# Patient Record
Sex: Female | Born: 1995 | Race: White | Hispanic: No | Marital: Single | Smoking: Never smoker
Health system: Southern US, Community
[De-identification: ages and names within clinical notes are randomized; demographics above are authoritative.]

## PROBLEM LIST (undated history)

## (undated) DIAGNOSIS — N83299 Other ovarian cyst, unspecified side: Secondary | ICD-10-CM

## (undated) DIAGNOSIS — N920 Excessive and frequent menstruation with regular cycle: Principal | ICD-10-CM

## (undated) DIAGNOSIS — Z789 Other specified health status: Secondary | ICD-10-CM

## (undated) DIAGNOSIS — R635 Abnormal weight gain: Secondary | ICD-10-CM

## (undated) DIAGNOSIS — R1031 Right lower quadrant pain: Secondary | ICD-10-CM

## (undated) DIAGNOSIS — N83209 Unspecified ovarian cyst, unspecified side: Secondary | ICD-10-CM

## (undated) DIAGNOSIS — R102 Pelvic and perineal pain: Secondary | ICD-10-CM

## (undated) DIAGNOSIS — U071 COVID-19: Secondary | ICD-10-CM

## (undated) DIAGNOSIS — N949 Unspecified condition associated with female genital organs and menstrual cycle: Secondary | ICD-10-CM

## (undated) DIAGNOSIS — G43909 Migraine, unspecified, not intractable, without status migrainosus: Secondary | ICD-10-CM

## (undated) HISTORY — DX: Unspecified condition associated with female genital organs and menstrual cycle: N94.9

## (undated) HISTORY — DX: Migraine, unspecified, not intractable, without status migrainosus: G43.909

## (undated) HISTORY — DX: Right lower quadrant pain: R10.31

## (undated) HISTORY — DX: Abnormal weight gain: R63.5

## (undated) HISTORY — DX: Pelvic and perineal pain: R10.2

## (undated) HISTORY — PX: NO PAST SURGERIES: SHX2092

## (undated) HISTORY — DX: Excessive and frequent menstruation with regular cycle: N92.0

## (undated) HISTORY — DX: Unspecified ovarian cyst, unspecified side: N83.209

---
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
## 2014-02-24 ENCOUNTER — Ambulatory Visit (INDEPENDENT_AMBULATORY_CARE_PROVIDER_SITE_OTHER): Payer: BC Managed Care – PPO | Admitting: Family Medicine

## 2014-02-24 ENCOUNTER — Ambulatory Visit: Payer: BC Managed Care – PPO

## 2014-02-24 VITALS — BP 102/64 | HR 79 | Temp 97.8°F | Resp 16 | Ht 66.0 in | Wt 153.4 lb

## 2014-02-24 DIAGNOSIS — R040 Epistaxis: Secondary | ICD-10-CM

## 2014-02-24 DIAGNOSIS — R1031 Right lower quadrant pain: Secondary | ICD-10-CM

## 2014-02-24 DIAGNOSIS — N946 Dysmenorrhea, unspecified: Secondary | ICD-10-CM

## 2014-02-24 DIAGNOSIS — R109 Unspecified abdominal pain: Secondary | ICD-10-CM

## 2014-02-24 DIAGNOSIS — R10A1 Flank pain, right side: Secondary | ICD-10-CM

## 2014-02-24 DIAGNOSIS — R042 Hemoptysis: Secondary | ICD-10-CM

## 2014-02-24 LAB — POCT URINALYSIS DIPSTICK
BILIRUBIN UA: NEGATIVE
Glucose, UA: NEGATIVE
KETONES UA: NEGATIVE
LEUKOCYTES UA: NEGATIVE
Nitrite, UA: NEGATIVE
SPEC GRAV UA: 1.02
Urobilinogen, UA: 0.2
pH, UA: 6

## 2014-02-24 LAB — POCT CBC
GRANULOCYTE PERCENT: 66.2 % (ref 37–80)
HCT, POC: 41 % (ref 37.7–47.9)
Hemoglobin: 13.1 g/dL (ref 12.2–16.2)
Lymph, poc: 2 (ref 0.6–3.4)
MCH, POC: 28.9 pg (ref 27–31.2)
MCHC: 32 g/dL (ref 31.8–35.4)
MCV: 90.4 fL (ref 80–97)
MID (CBC): 0.5 (ref 0–0.9)
MPV: 12.4 fL (ref 0–99.8)
PLATELET COUNT, POC: 254 10*3/uL (ref 142–424)
POC GRANULOCYTE: 4.8 (ref 2–6.9)
POC LYMPH %: 27.4 % (ref 10–50)
POC MID %: 6.4 % (ref 0–12)
RBC: 4.53 M/uL (ref 4.04–5.48)
RDW, POC: 14.5 %
WBC: 7.2 10*3/uL (ref 4.6–10.2)

## 2014-02-24 LAB — POCT UA - MICROSCOPIC ONLY
Casts, Ur, LPF, POC: NEGATIVE
Crystals, Ur, HPF, POC: NEGATIVE
MUCUS UA: POSITIVE
WBC, UR, HPF, POC: NEGATIVE
YEAST UA: NEGATIVE

## 2014-02-24 NOTE — Progress Notes (Signed)
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 She had not been sick with a cough for for infection. She is on her menstrual cycle for 2 days. She awakened at 4 AM with a severe right-sided pain. It was intense, no nausea or vomiting, no appetite. She said the pain as above where she would expect her ovarian pains to come from. She did feel short of breath. She is generally quite healthy.  Objective: Pleasant alert young lady. Throat clear. Nose unremarkable. Neck supple without nodes. Chest is clear to auscultation. Heart regular without murmurs gallops or arrhythmias abdomen has bowel sounds, soft without masses. It is tender in the far lateral right mid and lower abdomen. It seems to be more lateral to McBurney's point, but is tender over that way also. She does hurt in the right CVA area some, but the primary pain seems come lateral to that. The worst pain is somewhere above the lateral iliac crest.  Assessment: Right lower quadrant abdominal and flank pain Epistaxis Hemoptysis, probably secondary to the epistaxis Menstrual cycle  Plan: Chest x-ray, abdominal series, CBC, urinalysis.  This is an atypical pain. She is not on any oral contraception and not sexually involved, and the pain seems to be too low for a pulmonary embolus although she did have hemoptysis. Hemoptysis with preceding this and seems to be more associated with having had epistaxis. The pain does not seem to be consistent with dysmenorrhea. It also does not have enough symptoms to me totally consistent with an appendicitis. Kidney stone would be a possibility, though does not seem likely. Father has had kidney stones.  Results for orders placed in visit on 02/24/14  POCT URINALYSIS DIPSTICK      Result Value Ref Range   Color, UA dark yellow     Clarity, UA slightly cloudy     Glucose, UA neg     Bilirubin, UA  neg     Ketones, UA neg     Spec Grav, UA 1.020     Blood, UA large     pH, UA 6.0     Protein, UA trace     Urobilinogen, UA 0.2     Nitrite, UA neg     Leukocytes, UA Negative    POCT UA - MICROSCOPIC ONLY      Result Value Ref Range   WBC, Ur, HPF, POC neg     RBC, urine, microscopic 15-20     Bacteria, U Microscopic trace     Mucus, UA positive     Epithelial cells, urine per micros 0-1     Crystals, Ur, HPF, POC neg     Casts, Ur, LPF, POC neg     Yeast, UA neg    POCT CBC      Result Value Ref Range   WBC 7.2  4.6 - 10.2 K/uL   Lymph, poc 2.0  0.6 - 3.4   POC LYMPH PERCENT 27.4  10 - 50 %L   MID (cbc) 0.5  0 - 0.9   POC MID % 6.4  0 - 12 %M   POC Granulocyte 4.8  2 - 6.9   Granulocyte percent 66.2  37 - 80 %G   RBC 4.53  4.04 - 5.48 M/uL   Hemoglobin 13.1  12.2 - 16.2 g/dL   HCT, POC 16.1  09.6 - 47.9 %   MCV 90.4  80 - 97 fL   MCH, POC 28.9  27 - 31.2 pg   MCHC 32.0  31.8 - 35.4 g/dL   RDW, POC 45.414.5     Platelet Count, POC 254  142 - 424 K/uL   MPV 12.4  0 - 99.8 fL   [facility] reading (PRIMARY) by  Dr. Alwyn RenHopper Normal chest and abdomen  Abdominal pain, etiology still unclear. The blood in the urine is probably from her menses. The pain may be menstrual related. Will try to get a stat ultrasound of the abdomen and pelvis for right-sided abdominal  Reexamine. Pain seems a little bit more to the pelvic area. Will order ultrasound and see what the result shows. Patient left for the ultrasound at 11:15

## 2014-02-27 ENCOUNTER — Encounter: Payer: Self-pay | Admitting: Adult Health

## 2014-02-27 ENCOUNTER — Ambulatory Visit (INDEPENDENT_AMBULATORY_CARE_PROVIDER_SITE_OTHER): Payer: BC Managed Care – PPO | Admitting: Adult Health

## 2014-02-27 VITALS — BP 120/80 | Ht 68.0 in | Wt 158.0 lb

## 2014-02-27 DIAGNOSIS — R1031 Right lower quadrant pain: Secondary | ICD-10-CM

## 2014-02-27 DIAGNOSIS — Z7689 Persons encountering health services in other specified circumstances: Secondary | ICD-10-CM

## 2014-02-27 DIAGNOSIS — N920 Excessive and frequent menstruation with regular cycle: Secondary | ICD-10-CM

## 2014-02-27 HISTORY — DX: Excessive and frequent menstruation with regular cycle: N92.0

## 2014-02-27 HISTORY — DX: Right lower quadrant pain: R10.31

## 2014-02-27 MED ORDER — NORETHINDRONE 0.35 MG PO TABS: 1.0000 | ORAL_TABLET | Freq: Every day | ORAL | Status: DC

## 2014-02-27 NOTE — Progress Notes (Signed)
Subjective:     Patient ID: Diane Shelton, female   DOB: [birth year], 18 y.o.   MRN: [id number]  HPI Diane Shelton is a 18 year old white female, in complaining of having pain in right side, Sunday, was seen at Urgent Care, had negative labs,xray and urine.Period started too.Has heavy periods.She started at age 18 and they are regular.Has migraines with aura.Got all 3 gardasil shots, has never had sex.She is a IT sales professionalfirefighter.  Review of Systems See HPI Reviewed past medical,surgical, social and family history. Reviewed medications and allergies.     Objective:   Physical Exam BP 120/80  Ht 5\' 8"  (1.727 m)  Wt 158 lb (71.668 kg)  BMI 24.03 kg/m2  LMP 02/23/2014    Skin warm and dry.Pelvic: external genitalia is normal in appearance, vagina: period like blood, cervix:smooth,negative CMT, uterus: normal size, shape and contour, non tender, no masses felt, adnexa: no masses, has some tenderness noted in RLQ over ovary. Discussed with pt and Mom could be ovarian cyst, will get US and will start OCs, and they agree.  Assessment:     Menorrhagia RLQ pain Period management    Plan:     Rx micronor disp 1 pack take 1 daily with 11 refills Return in 1 week for US Review handout on menorrhagia,pelvic pain and ovarian cyst   Call prn

## 2014-02-27 NOTE — Patient Instructions (Signed)
Ovarian Cyst An ovarian cyst is a fluid-filled sac that forms on an ovary. The ovaries are small organs that produce eggs in women. Various types of cysts can form on the ovaries. Most are not cancerous. Many do not cause problems, and they often go away on their own. Some may cause symptoms and require treatment. Common types of ovarian cysts include:  Functional cysts These cysts may occur every month during the menstrual cycle. This is normal. The cysts usually go away with the next menstrual cycle if the woman does not get pregnant. Usually, there are no symptoms with a functional cyst.  Endometrioma cysts These cysts form from the tissue that lines the uterus. They are also called "chocolate cysts" because they become filled with blood that turns brown. This type of cyst can cause pain in the lower abdomen during intercourse and with your menstrual period.  Cystadenoma cysts This type develops from the cells on the outside of the ovary. These cysts can get very big and cause lower abdomen pain and pain with intercourse. This type of cyst can twist on itself, cut off its blood supply, and cause severe pain. It can also easily rupture and cause a lot of pain.  Dermoid cysts This type of cyst is sometimes found in both ovaries. These cysts may contain different kinds of body tissue, such as skin, teeth, hair, or cartilage. They usually do not cause symptoms unless they get very big.  Theca lutein cysts These cysts occur when too much of a certain hormone (human chorionic gonadotropin) is produced and overstimulates the ovaries to produce an egg. This is most common after procedures used to assist with the conception of a baby (in vitro fertilization). CAUSES   Fertility drugs can cause a condition in which multiple large cysts are formed on the ovaries. This is called ovarian hyperstimulation syndrome.  A condition called polycystic ovary syndrome can cause hormonal imbalances that can lead to  nonfunctional ovarian cysts. SIGNS AND SYMPTOMS  Many ovarian cysts do not cause symptoms. If symptoms are present, they may include:  Pelvic pain or pressure.  Pain in the lower abdomen.  Pain during sexual intercourse.  Increasing girth (swelling) of the abdomen.  Abnormal menstrual periods.  Increasing pain with menstrual periods.  Stopping having menstrual periods without being pregnant. DIAGNOSIS  These cysts are commonly found during a routine or annual pelvic exam. Tests may be ordered to find out more about the cyst. These tests may include:  Ultrasound.  X-ray of the pelvis.  CT scan.  MRI.  Blood tests. TREATMENT  Many ovarian cysts go away on their own without treatment. Your health care provider may want to check your cyst regularly for 2 3 months to see if it changes. For women in menopause, it is particularly important to monitor a cyst closely because of the higher rate of ovarian cancer in menopausal women. When treatment is needed, it may include any of the following:  A procedure to drain the cyst (aspiration). This may be done using a long needle and ultrasound. It can also be done through a laparoscopic procedure. This involves using a thin, lighted tube with a tiny camera on the end (laparoscope) inserted through a small incision.  Surgery to remove the whole cyst. This may be done using laparoscopic surgery or an open surgery involving a larger incision in the lower abdomen.  Hormone treatment or birth control pills. These methods are sometimes used to help dissolve a cyst. HOME CARE   INSTRUCTIONS   Only take over-the-counter or prescription medicines as directed by your health care provider.  Follow up with your health care provider as directed.  Get regular pelvic exams and Pap tests. SEEK MEDICAL CARE IF:   Your periods are late, irregular, or painful, or they stop.  Your pelvic pain or abdominal pain does not go away.  Your abdomen becomes  larger or swollen.  You have pressure on your bladder or trouble emptying your bladder completely.  You have pain during sexual intercourse.  You have feelings of fullness, pressure, or discomfort in your stomach.  You lose weight for no apparent reason.  You feel generally ill.  You become constipated.  You lose your appetite.  You develop acne.  You have an increase in body and facial hair.  You are gaining weight, without changing your exercise and eating habits.  You think you are pregnant. SEEK IMMEDIATE MEDICAL CARE IF:   You have increasing abdominal pain.  You feel sick to your stomach (nauseous), and you throw up (vomit).  You develop a fever that comes on suddenly.  You have abdominal pain during a bowel movement.  Your menstrual periods become heavier than usual. Document Released: 12/05/2005 Document Revised: 09/25/2013 Document Reviewed: 08/12/2013 [facility] Patient Information 2014 [place], [place]. Pelvic Pain, Female Female pelvic pain can be caused by many different things and start from a variety of places. Pelvic pain refers to pain that is located in the lower half of the abdomen and between your hips. The pain may occur over a short period of time (acute) or may be reoccurring (chronic). The cause of pelvic pain may be related to disorders affecting the female reproductive organs (gynecologic), but it may also be related to the bladder, kidney stones, an intestinal complication, or muscle or skeletal problems. Getting help right away for pelvic pain is important, especially if there has been severe, sharp, or a sudden onset of unusual pain. It is also important to get help right away because some types of pelvic pain can be life threatening.  CAUSES  Below are only some of the causes of pelvic pain. The causes of pelvic pain can be in one of several categories.   Gynecologic.  Pelvic inflammatory disease.  Sexually transmitted infection.  Ovarian cyst  or a twisted ovarian ligament (ovarian torsion).  Uterine lining that grows outside the uterus (endometriosis).  Fibroids, cysts, or tumors.  Ovulation.  Pregnancy.  Pregnancy that occurs outside the uterus (ectopic pregnancy).  Miscarriage.  Labor.  Abruption of the placenta or ruptured uterus.  Infection.  Uterine infection (endometritis).  Bladder infection.  Diverticulitis.  Miscarriage related to a uterine infection (septic abortion).  Bladder.  Inflammation of the bladder (cystitis).  Kidney stone(s).  Gastrointenstinal.  Constipation.  Diverticulitis.  Neurologic.  Trauma.  Feeling pelvic pain because of mental or emotional causes (psychosomatic).  Cancers of the bowel or pelvis. EVALUATION  Your caregiver will want to take a careful history of your concerns. This includes recent changes in your health, a careful gynecologic history of your periods (menses), and a sexual history. Obtaining your family history and medical history is also important. Your caregiver may suggest a pelvic exam. A pelvic exam will help identify the location and severity of the pain. It also helps in the evaluation of which organ system may be involved. In order to identify the cause of the pelvic pain and be properly treated, your caregiver may order tests. These tests may include:   A  pregnancy test.  Pelvic ultrasonography.  An X-ray exam of the abdomen.  A urinalysis or evaluation of vaginal discharge.  Blood tests. HOME CARE INSTRUCTIONS   Only take over-the-counter or prescription medicines for pain, discomfort, or fever as directed by your caregiver.   Rest as directed by your caregiver.   Eat a balanced diet.   Drink enough fluids to make your urine clear or pale yellow, or as directed.   Avoid sexual intercourse if it causes pain.   Apply warm or cold compresses to the lower abdomen depending on which one helps the pain.   Avoid stressful  situations.   Keep a journal of your pelvic pain. Write down when it started, where the pain is located, and if there are things that seem to be associated with the pain, such as food or your menstrual cycle.  Follow up with your caregiver as directed.  SEEK MEDICAL CARE IF:  Your medicine does not help your pain.  You have abnormal vaginal discharge. SEEK IMMEDIATE MEDICAL CARE IF:   You have heavy bleeding from the vagina.   Your pelvic pain increases.   You feel lightheaded or faint.   You have chills.   You have pain with urination or blood in your urine.   You have uncontrolled diarrhea or vomiting.   You have a fever or persistent symptoms for more than 3 days.  You have a fever and your symptoms suddenly get worse.   You are being physically or sexually abused.  MAKE SURE YOU:  Understand these instructions.  Will watch your condition.  Will get help if you are not doing well or get worse. Document Released: 11/01/2004 Document Revised: 06/05/2012 Document Reviewed: 03/26/2012 [facility] Patient Information 2014 [place], [place]. Menorrhagia Menorrhagia is the medical term for when your menstrual periods are heavy or last longer than usual. With menorrhagia, every period you have may cause enough blood loss and cramping that you are unable to maintain your usual activities. CAUSES  In some cases, the cause of heavy periods is unknown, but a number of conditions may cause menorrhagia. Common causes include:  A problem with the hormone-producing thyroid gland (hypothyroid).  Noncancerous growths in the uterus (polyps or fibroids).  An imbalance of the estrogen and progesterone hormones.  One of your ovaries not releasing an egg during one or more months.  Side effects of having an intrauterine device (IUD).  Side effects of some medicines, such as anti-inflammatory medicines or blood thinners.  A bleeding disorder that stops your blood from clotting  normally. SIGNS AND SYMPTOMS  During a normal period, bleeding lasts between 4 and 8 days. Signs that your periods are too heavy include:  You routinely have to change your pad or tampon every 1 or 2 hours because it is completely soaked.  You pass blood clots larger than 1 inch (2.5 cm) in size.  You have bleeding for more than 7 days.  You need to use pads and tampons at the same time because of heavy bleeding.  You need to wake up to change your pads or tampons during the night.  You have symptoms of anemia, such as tiredness, fatigue, or shortness of breath. DIAGNOSIS  Your health care provider will perform a physical exam and ask you questions about your symptoms and menstrual history. Other tests may be ordered based on what the health care provider finds during the exam. These tests can include:  Blood tests To check if you are pregnant or have  hormonal changes, a bleeding or thyroid disorder, low iron levels (anemia), or other problems.  Endometrial biopsy Your health care provider takes a sample of tissue from the inside of your uterus to be examined under a microscope.  Pelvic ultrasound This test uses sound waves to make a picture of your uterus, ovaries, and vagina. The pictures can show if you have fibroids or other growths.  Hysteroscopy For this test, your health care provider will use a small telescope to look inside your uterus. Based on the results of your initial tests, your health care provider may recommend further testing. TREATMENT  Treatment may not be needed. If it is needed, your health care provider may recommend treatment with one or more medicines first. If these do not reduce bleeding enough, a surgical treatment might be an option. The best treatment for you will depend on:   Whether you need to prevent pregnancy.  Your desire to have children in the future.  The cause and severity of your bleeding.  Your opinion and personal preference.   Medicines for menorrhagia may include:  Birth control methods that use hormones These include the pill, skin patch, vaginal ring, shots that you get every 3 months, hormonal IUD, and implant. These treatments reduce bleeding during your menstrual period.  Medicines that thicken blood and slow bleeding.  Medicines that reduce swelling, such as ibuprofen.  Medicines that contain a synthetic hormone called progestin.   Medicines that make the ovaries stop working for a short time.  You may need surgical treatment for menorrhagia if the medicines are unsuccessful. Treatment options include:  Dilation and curettage (D&C) In this procedure, your health care provider opens (dilates) your cervix and then scrapes or suctions tissue from the lining of your uterus to reduce menstrual bleeding.  Operative hysteroscopy This procedure uses a tiny tube with a light (hysteroscope) to view your uterine cavity and can help in the surgical removal of a polyp that may be causing heavy periods.  Endometrial ablation Through various techniques, your health care provider permanently destroys the entire lining of your uterus (endometrium). After endometrial ablation, most women have little or no menstrual flow. Endometrial ablation reduces your ability to become pregnant.  Endometrial resection This surgical procedure uses an electrosurgical wire loop to remove the lining of the uterus. This procedure also reduces your ability to become pregnant.  Hysterectomy Surgical removal of the uterus and cervix is a permanent procedure that stops menstrual periods. Pregnancy is not possible after a hysterectomy. This procedure requires anesthesia and hospitalization. HOME CARE INSTRUCTIONS   Only take over-the-counter or prescription medicines as directed by your health care provider. Take prescribed medicines exactly as directed. Do not change or switch medicines without consulting your health care provider.  Take  any prescribed iron pills exactly as directed by your health care provider. Long-term heavy bleeding may result in low iron levels. Iron pills help replace the iron your body lost from heavy bleeding. Iron may cause constipation. If this becomes a problem, increase the bran, fruits, and roughage in your diet.  Do not take aspirin or medicines that contain aspirin 1 week before or during your menstrual period. Aspirin may make the bleeding worse.  If you need to change your sanitary pad or tampon more than once every 2 hours, stay in bed and rest as much as possible until the bleeding stops.  Eat well-balanced meals. Eat foods high in iron. Examples are leafy green vegetables, meat, liver, eggs, and whole grain breads  and cereals. Do not try to lose weight until the abnormal bleeding has stopped and your blood iron level is back to normal. SEEK MEDICAL CARE IF:   You soak through a pad or tampon every 1 or 2 hours, and this happens every time you have a period.  You need to use pads and tampons at the same time because you are bleeding so much.  You need to change your pad or tampon during the night.  You have a period that lasts for more than 8 days.  You pass clots bigger than 1 inch wide.  You have irregular periods that happen more or less often than once a month.  You feel dizzy or faint.  You feel very weak or tired.  You feel short of breath or feel your heart is beating too fast when you exercise.  You have nausea and vomiting or diarrhea while you are taking your medicine.  You have any problems that may be related to the medicine you are taking. SEEK IMMEDIATE MEDICAL CARE IF:   You soak through 4 or more pads or tampons in 2 hours.  You have any bleeding while you are pregnant. MAKE SURE YOU:   Understand these instructions.  Will watch your condition.  Will get help right away if you are not doing well or get worse. Document Released: 12/05/2005 Document Revised:  09/25/2013 Document Reviewed: 05/26/2013 [facility] Patient Information 2014 [place], [place]. Start the micronor Sunday Return in 1 week for [place] Call prn

## 2014-03-04 ENCOUNTER — Encounter: Payer: BC Managed Care – PPO | Admitting: Adult Health

## 2014-03-07 ENCOUNTER — Ambulatory Visit (INDEPENDENT_AMBULATORY_CARE_PROVIDER_SITE_OTHER): Payer: BC Managed Care – PPO

## 2014-03-07 ENCOUNTER — Encounter: Payer: Self-pay | Admitting: Adult Health

## 2014-03-07 ENCOUNTER — Other Ambulatory Visit: Payer: Self-pay | Admitting: Adult Health

## 2014-03-07 ENCOUNTER — Ambulatory Visit (INDEPENDENT_AMBULATORY_CARE_PROVIDER_SITE_OTHER): Payer: BC Managed Care – PPO | Admitting: Adult Health

## 2014-03-07 VITALS — BP 90/52 | Ht 67.0 in | Wt 152.0 lb

## 2014-03-07 DIAGNOSIS — N920 Excessive and frequent menstruation with regular cycle: Secondary | ICD-10-CM

## 2014-03-07 DIAGNOSIS — R1031 Right lower quadrant pain: Secondary | ICD-10-CM

## 2014-03-07 DIAGNOSIS — N83209 Unspecified ovarian cyst, unspecified side: Secondary | ICD-10-CM

## 2014-03-07 HISTORY — DX: Unspecified ovarian cyst, unspecified side: N83.209

## 2014-03-07 NOTE — Patient Instructions (Signed)
Ovarian Cyst An ovarian cyst is a fluid-filled sac that forms on an ovary. The ovaries are small organs that produce eggs in women. Various types of cysts can form on the ovaries. Most are not cancerous. Many do not cause problems, and they often go away on their own. Some may cause symptoms and require treatment. Common types of ovarian cysts include:  Functional cysts These cysts may occur every month during the menstrual cycle. This is normal. The cysts usually go away with the next menstrual cycle if the woman does not get pregnant. Usually, there are no symptoms with a functional cyst.  Endometrioma cysts These cysts form from the tissue that lines the uterus. They are also called "chocolate cysts" because they become filled with blood that turns brown. This type of cyst can cause pain in the lower abdomen during intercourse and with your menstrual period.  Cystadenoma cysts This type develops from the cells on the outside of the ovary. These cysts can get very big and cause lower abdomen pain and pain with intercourse. This type of cyst can twist on itself, cut off its blood supply, and cause severe pain. It can also easily rupture and cause a lot of pain.  Dermoid cysts This type of cyst is sometimes found in both ovaries. These cysts may contain different kinds of body tissue, such as skin, teeth, hair, or cartilage. They usually do not cause symptoms unless they get very big.  Theca lutein cysts These cysts occur when too much of a certain hormone (human chorionic gonadotropin) is produced and overstimulates the ovaries to produce an egg. This is most common after procedures used to assist with the conception of a baby (in vitro fertilization). CAUSES   Fertility drugs can cause a condition in which multiple large cysts are formed on the ovaries. This is called ovarian hyperstimulation syndrome.  A condition called polycystic ovary syndrome can cause hormonal imbalances that can lead to  nonfunctional ovarian cysts. SIGNS AND SYMPTOMS  Many ovarian cysts do not cause symptoms. If symptoms are present, they may include:  Pelvic pain or pressure.  Pain in the lower abdomen.  Pain during sexual intercourse.  Increasing girth (swelling) of the abdomen.  Abnormal menstrual periods.  Increasing pain with menstrual periods.  Stopping having menstrual periods without being pregnant. DIAGNOSIS  These cysts are commonly found during a routine or annual pelvic exam. Tests may be ordered to find out more about the cyst. These tests may include:  Ultrasound.  X-ray of the pelvis.  CT scan.  MRI.  Blood tests. TREATMENT  Many ovarian cysts go away on their own without treatment. Your health care provider may want to check your cyst regularly for 2 3 months to see if it changes. For women in menopause, it is particularly important to monitor a cyst closely because of the higher rate of ovarian cancer in menopausal women. When treatment is needed, it may include any of the following:  A procedure to drain the cyst (aspiration). This may be done using a long needle and ultrasound. It can also be done through a laparoscopic procedure. This involves using a thin, lighted tube with a tiny camera on the end (laparoscope) inserted through a small incision.  Surgery to remove the whole cyst. This may be done using laparoscopic surgery or an open surgery involving a larger incision in the lower abdomen.  Hormone treatment or birth control pills. These methods are sometimes used to help dissolve a cyst. HOME CARE   INSTRUCTIONS   Only take over-the-counter or prescription medicines as directed by your health care provider.  Follow up with your health care provider as directed.  Get regular pelvic exams and Pap tests. SEEK MEDICAL CARE IF:   Your periods are late, irregular, or painful, or they stop.  Your pelvic pain or abdominal pain does not go away.  Your abdomen becomes  larger or swollen.  You have pressure on your bladder or trouble emptying your bladder completely.  You have pain during sexual intercourse.  You have feelings of fullness, pressure, or discomfort in your stomach.  You lose weight for no apparent reason.  You feel generally ill.  You become constipated.  You lose your appetite.  You develop acne.  You have an increase in body and facial hair.  You are gaining weight, without changing your exercise and eating habits.  You think you are pregnant. SEEK IMMEDIATE MEDICAL CARE IF:   You have increasing abdominal pain.  You feel sick to your stomach (nauseous), and you throw up (vomit).  You develop a fever that comes on suddenly.  You have abdominal pain during a bowel movement.  Your menstrual periods become heavier than usual. Document Released: 12/05/2005 Document Revised: 09/25/2013 Document Reviewed: 08/12/2013 [facility] Patient Information 2014 [place], [place]. Return 3 months

## 2014-03-07 NOTE — Progress Notes (Signed)
Subjective:     Patient ID: Diane Shelton, female   DOB: [birth year], 18 y.o.   MRN: [id number]  HPI Diane Shelton is back for US had RLQ pain.  Review of Systems See HPI Reviewed past medical,surgical, social and family history. Reviewed medications and allergies.     Objective:   Physical Exam BP 90/52  Ht 5\' 7"  (1.702 m)  Wt 152 lb (68.947 kg)  BMI 23.80 kg/m2  LMP 03/08/2015Reviewed US with pt: Uterus 7.2 x 6.1 x 4.8 cm, retroflexed uterus  Endometrium 6.2 mm, symmetrical,  Right ovary 3.6 x 2.9 x 2.8 cm, with 3.1 x 1.9cm simple cyst noted no internal debris or +Doppler flow noted  Left ovary 3.2 x 2.1 x 1.7 cm,  No free fluid noted  Technician Comments:  Retroflexed uterus noted, endometrium-6.292mm, Rt ovary noted with 3.1 x 1.9cm simple cyst, Lt ovary appears WNL, no free fluid noted within pelvis  Difficult to evaluate uterus well due to transabdominal approach only (per pt. Request)     Assessment:     Right ovarian cyst    Plan:     Take micronor Follow up in 3 months Review handout on ovarian cyst

## 2014-06-06 ENCOUNTER — Ambulatory Visit: Payer: BC Managed Care – PPO | Admitting: Adult Health

## 2014-08-26 ENCOUNTER — Ambulatory Visit (INDEPENDENT_AMBULATORY_CARE_PROVIDER_SITE_OTHER): Payer: BC Managed Care – PPO | Admitting: Adult Health

## 2014-08-26 ENCOUNTER — Encounter: Payer: Self-pay | Admitting: Adult Health

## 2014-08-26 VITALS — BP 100/58 | Ht 68.0 in | Wt 159.5 lb

## 2014-08-26 DIAGNOSIS — N949 Unspecified condition associated with female genital organs and menstrual cycle: Secondary | ICD-10-CM

## 2014-08-26 HISTORY — DX: Unspecified condition associated with female genital organs and menstrual cycle: N94.9

## 2014-08-26 MED ORDER — NAPROXEN SODIUM 550 MG PO TABS
550.0000 mg | ORAL_TABLET | Freq: Two times a day (BID) | ORAL | Status: DC
Start: 2014-08-26 — End: 2014-12-29

## 2014-08-26 NOTE — Progress Notes (Signed)
Subjective:     Patient ID: Diane Shelton, female   DOB: May 26, 1996, 18 y.o.   MRN: [id number]  HPI Diane Shelton is a 18 year old white female in complaining of pelvic pain, like when had cyst rupture in past.Is on micronor, but has missed 3 days of this pack.Has not had sex.  Review of Systems See HPI Reviewed past medical,surgical, social and family history. Reviewed medications and allergies.     Objective:   Physical Exam BP 100/58  Ht  (1.727 m)  Wt 159 lb 8 oz (72.349 kg)  BMI 24.26 kg/m2  LMP 08/24/2014   Skin warm and dry.Pelvic: external genitalia is normal in appearance, vagina: period like blood, cervix:smooth, uterus: normal size, shape and contour, non tender, no masses felt, adnexa: no masses, has some  tenderness noted across pelvis more to left now.Pain 5-6, was 8 last night.  Assessment:     Pelvic pain    Plan:    Take micronor Note given for work Rx anaprox ds #30 1 Bid with 2 refills Return in 2-3 days for [place] and see me Review handout on pelvic pain

## 2014-08-26 NOTE — Patient Instructions (Signed)
Pelvic Pain Female pelvic pain can be caused by many different things and start from a variety of places. Pelvic pain refers to pain that is located in the lower half of the abdomen and between your hips. The pain may occur over a short period of time (acute) or may be reoccurring (chronic). The cause of pelvic pain may be related to disorders affecting the female reproductive organs (gynecologic), but it may also be related to the bladder, kidney stones, an intestinal complication, or muscle or skeletal problems. Getting help right away for pelvic pain is important, especially if there has been severe, sharp, or a sudden onset of unusual pain. It is also important to get help right away because some types of pelvic pain can be life threatening.  CAUSES  Below are only some of the causes of pelvic pain. The causes of pelvic pain can be in one of several categories.   Gynecologic.  Pelvic inflammatory disease.  Sexually transmitted infection.  Ovarian cyst or a twisted ovarian ligament (ovarian torsion).  Uterine lining that grows outside the uterus (endometriosis).  Fibroids, cysts, or tumors.  Ovulation.  Pregnancy.  Pregnancy that occurs outside the uterus (ectopic pregnancy).  Miscarriage.  Labor.  Abruption of the placenta or ruptured uterus.  Infection.  Uterine infection (endometritis).  Bladder infection.  Diverticulitis.  Miscarriage related to a uterine infection (septic abortion).  Bladder.  Inflammation of the bladder (cystitis).  Kidney stone(s).  Gastrointestinal.  Constipation.  Diverticulitis.  Neurologic.  Trauma.  Feeling pelvic pain because of mental or emotional causes (psychosomatic).  Cancers of the bowel or pelvis. EVALUATION  Your caregiver will want to take a careful history of your concerns. This includes recent changes in your health, a careful gynecologic history of your periods (menses), and a sexual history. Obtaining your family  history and medical history is also important. Your caregiver may suggest a pelvic exam. A pelvic exam will help identify the location and severity of the pain. It also helps in the evaluation of which organ system may be involved. In order to identify the cause of the pelvic pain and be properly treated, your caregiver may order tests. These tests may include:   A pregnancy test.  Pelvic ultrasonography.  An X-ray exam of the abdomen.  A urinalysis or evaluation of vaginal discharge.  Blood tests. HOME CARE INSTRUCTIONS   Only take over-the-counter or prescription medicines for pain, discomfort, or fever as directed by your caregiver.   Rest as directed by your caregiver.   Eat a balanced diet.   Drink enough fluids to make your urine clear or pale yellow, or as directed.   Avoid sexual intercourse if it causes pain.   Apply warm or cold compresses to the lower abdomen depending on which one helps the pain.   Avoid stressful situations.   Keep a journal of your pelvic pain. Write down when it started, where the pain is located, and if there are things that seem to be associated with the pain, such as food or your menstrual cycle.  Follow up with your caregiver as directed.  SEEK MEDICAL CARE IF:  Your medicine does not help your pain.  You have abnormal vaginal discharge. SEEK IMMEDIATE MEDICAL CARE IF:   You have heavy bleeding from the vagina.   Your pelvic pain increases.   You feel light-headed or faint.   You have chills.   You have pain with urination or blood in your urine.   You have uncontrolled diarrhea   or vomiting.   You have a fever or persistent symptoms for more than 3 days.  You have a fever and your symptoms suddenly get worse.   You are being physically or sexually abused.  MAKE SURE YOU:  Understand these instructions.  Will watch your condition.  Will get help if you are not doing well or get worse. Document Released:  11/01/2004 Document Revised: 04/21/2014 Document Reviewed: 03/26/2012 [facility] Patient Information 2015 [place], [place]. This information is not intended to replace advice given to you by your health care provider. Make sure you discuss any questions you have with your health care provider. Take micronor Return in 3 days for [place]

## 2014-08-28 ENCOUNTER — Ambulatory Visit: Payer: BC Managed Care – PPO | Admitting: Adult Health

## 2014-08-28 ENCOUNTER — Other Ambulatory Visit: Payer: BC Managed Care – PPO

## 2014-09-15 ENCOUNTER — Ambulatory Visit: Payer: BC Managed Care – PPO | Admitting: Adult Health

## 2014-09-15 ENCOUNTER — Other Ambulatory Visit: Payer: BC Managed Care – PPO

## 2014-09-17 ENCOUNTER — Ambulatory Visit
Admission: RE | Admit: 2014-09-17 | Discharge: 2014-09-17 | Disposition: A | Discharge status: Home / Self Care | Payer: Worker's Compensation | Source: Ambulatory Visit | Attending: Family Medicine | Admitting: Family Medicine

## 2014-09-17 ENCOUNTER — Other Ambulatory Visit: Payer: Self-pay | Admitting: Family Medicine

## 2014-09-17 DIAGNOSIS — T1490XA Injury, unspecified, initial encounter: Secondary | ICD-10-CM

## 2014-12-29 ENCOUNTER — Ambulatory Visit (INDEPENDENT_AMBULATORY_CARE_PROVIDER_SITE_OTHER): Payer: BC Managed Care – PPO | Admitting: Physician Assistant

## 2014-12-29 VITALS — BP 110/68 | HR 76 | Temp 98.3°F | Resp 18 | Ht 67.5 in | Wt 184.0 lb

## 2014-12-29 DIAGNOSIS — J029 Acute pharyngitis, unspecified: Secondary | ICD-10-CM

## 2014-12-29 DIAGNOSIS — R5383 Other fatigue: Secondary | ICD-10-CM

## 2014-12-29 LAB — POCT CBC
Granulocyte percent: 50.6 %G (ref 37–80)
HEMATOCRIT: 41.1 % (ref 37.7–47.9)
Hemoglobin: 13.2 g/dL (ref 12.2–16.2)
Lymph, poc: 2.9 (ref 0.6–3.4)
MCH: 27.7 pg (ref 27–31.2)
MCHC: 32.1 g/dL (ref 31.8–35.4)
MCV: 86.2 fL (ref 80–97)
MID (cbc): 0.5 (ref 0–0.9)
MPV: 8.9 fL (ref 0–99.8)
PLATELET COUNT, POC: 249 10*3/uL (ref 142–424)
POC GRANULOCYTE: 3.5 (ref 2–6.9)
POC LYMPH PERCENT: 41.9 %L (ref 10–50)
POC MID %: 7.5 %M (ref 0–12)
RBC: 4.77 M/uL (ref 4.04–5.48)
RDW, POC: 14.8 %
WBC: 7 10*3/uL (ref 4.6–10.2)

## 2014-12-29 LAB — POCT RAPID STREP A (OFFICE): Rapid Strep A Screen: NEGATIVE

## 2014-12-29 NOTE — Patient Instructions (Signed)
Your strep swab was negative today. We sent this for confirmation and will let you know if that comes back different. We drew labs to look to see if you have an EBV infection which could cause mono. I'll let you know the results of those when they get back.  We drew blood today which was normal which is reassuring that you don't have mono.  Please get plenty of rest, drink plenty of fluids, take ibuprofen for pain every 4-6 hours as needed. If you're not feeling better in 3-4 days please return to clinic.  If you have trouble breathing or swallowing please go to the ED asap.

## 2014-12-29 NOTE — Progress Notes (Signed)
Subjective:    Patient ID: Diane Shelton, female    DOB: 1996/10/07, 19 y.o.   MRN: [id number]  PCP: No PCP Per Patient  Chief Complaint  Patient presents with  . Sore Throat    x2 days   . Fatigue   Patient Active Problem List   Diagnosis Date Noted  . Unspecified symptom associated with female genital organs 08/26/2014  . Other and unspecified ovarian cyst 03/07/2014  . Menorrhagia 02/27/2014   Prior to Admission medications   Medication Sig Start Date End Date Taking? Authorizing Provider  norethindrone (MICRONOR,CAMILA,ERRIN) 0.35 MG tablet Take 1 tablet (0.35 mg total) by mouth daily. 02/27/14  Yes Adline Potter, NP   Medications, allergies, past medical history, surgical history, family history, social history and problem list reviewed and updated.  HPI  18 yof with pmh mono 6 months ago presents today with sore throat and fatigue.   She began feeling fatigued approx 3 days ago. Has felt weak. No presyncope or syncope. She then started having a sore throat 2 days ago. No trouble breathing or swallowing. She has had a mild non prod cough past few days. No abd pain, N/V, diarrhea. No rhinorrhea, no otalgia. No SOB, CP, fever, chills. Denies any HA.   She states she had these sx 6 months ago and was eventually diag with mono. She stayed out of work for one week and got plenty of rest, felt better after approx one month.   Review of Systems No dysuria.     Objective:   Physical Exam  Constitutional: She is oriented to person, place, and time. She appears well-developed and well-nourished.  Non-toxic appearance. She does not have a sickly appearance. She does not appear ill. No distress.  BP 110/68 mmHg  Pulse 76  Temp(Src) 98.3 F (36.8 C) (Oral)  Resp 18  Ht 5' 7.5" (1.715 m)  Wt 184 lb (83.462 kg)  BMI 28.38 kg/m2  SpO2 100%  LMP 12/06/2014   HENT:  Right Ear: Tympanic membrane normal.  Left Ear: Tympanic membrane normal.  Nose: Nose normal. No mucosal  edema or rhinorrhea.  Mouth/Throat: Uvula is midline and mucous membranes are normal. No uvula swelling. Oropharyngeal exudate present. No posterior oropharyngeal edema, posterior oropharyngeal erythema or tonsillar abscesses.  Right tonsil slightly enlarged with exudates and mild erythema. No pharyngeal exudates or erythema.   Eyes: Conjunctivae and EOM are normal. Pupils are equal, round, and reactive to light.  Pulmonary/Chest: Effort normal and breath sounds normal. She has no decreased breath sounds. She has no wheezes. She has no rhonchi. She has no rales.  Abdominal: There is no splenomegaly.  Lymphadenopathy:       Head (right side): No submental, no submandibular and no tonsillar adenopathy present.       Head (left side): No submental, no submandibular and no tonsillar adenopathy present.       Right cervical: No superficial cervical, no deep cervical and no posterior cervical adenopathy present.      Left cervical: No superficial cervical, no deep cervical and no posterior cervical adenopathy present.  Neurological: She is alert and oriented to person, place, and time.   Results for orders placed or performed in visit on 12/29/14  POCT rapid strep A  Result Value Ref Range   Rapid Strep A Screen Negative Negative  POCT CBC  Result Value Ref Range   WBC 7.0 4.6 - 10.2 K/uL   Lymph, poc 2.9 0.6 - 3.4   POC LYMPH  PERCENT 41.9 10 - 50 %L   MID (cbc) 0.5 0 - 0.9   POC MID % 7.5 0 - 12 %M   POC Granulocyte 3.5 2 - 6.9   Granulocyte percent 50.6 37 - 80 %G   RBC 4.77 4.04 - 5.48 M/uL   Hemoglobin 13.2 12.2 - 16.2 g/dL   HCT, POC 09.841.1 11.937.7 - 47.9 %   MCV 86.2 80 - 97 fL   MCH, POC 27.7 27 - 31.2 pg   MCHC 32.1 31.8 - 35.4 g/dL   RDW, POC 14.714.8 %   Platelet Count, POC 249 142 - 424 K/uL   MPV 8.9 0 - 99.8 fL      Assessment & Plan:   18 yof with pmh mono 6 months ago presents today with sore throat and fatigue.   Sore throat - Plan: POCT rapid strep A, Culture, Group A  Strep, POCT CBC, Epstein-Barr virus VCA antibody panel Other fatigue - Plan: POCT CBC, Epstein-Barr virus VCA antibody panel --rapid strep neg, cx sent --cbc normal with no lymphocytosis --ebv abi panel drawn, f/u on results though low suspicion for mono with no fever, no headache, no cervical lan, no splenomegaly, and no increased lymphs --rest/fluids/lozenges/ibuprofen --rtc 3-4 days if not improved --er if trouble breathing/swallowing  Donnajean Lopesodd M. Markee Remlinger, PA-C Physician Assistant-Certified [facility]  12/29/2014 2:07 PM

## 2014-12-30 LAB — EPSTEIN-BARR VIRUS VCA ANTIBODY PANEL
EBV EA IGG: 8.7 U/mL (ref <9.0)
EBV NA IgG: 174 U/mL — ABNORMAL HIGH (ref <18.0)
EBV VCA IGM: 20.4 U/mL (ref <36.0)
EBV VCA IgG: 63.2 U/mL — ABNORMAL HIGH (ref <18.0)

## 2014-12-31 LAB — CULTURE, GROUP A STREP

## 2015-01-02 ENCOUNTER — Telehealth: Payer: Self-pay | Admitting: Physician Assistant

## 2015-01-02 NOTE — Telephone Encounter (Signed)
Left message with pt that EBV infx was old, no acute infx. Informed pt that cx came back positive for strep but not group A. Instructed if she's better no further treatment is needed, but if she continues to have sore throat to call clinic, let me know.   Donnajean Lopesodd M. Khary Schaben, PA-C Physician Assistant-Certified [facility]  01/02/2015 1:12 PM

## 2015-01-29 ENCOUNTER — Encounter: Payer: Self-pay | Admitting: Adult Health

## 2015-01-29 ENCOUNTER — Ambulatory Visit (INDEPENDENT_AMBULATORY_CARE_PROVIDER_SITE_OTHER): Payer: BC Managed Care – PPO | Admitting: Adult Health

## 2015-01-29 VITALS — BP 128/60 | Ht 68.0 in | Wt 179.0 lb

## 2015-01-29 DIAGNOSIS — N920 Excessive and frequent menstruation with regular cycle: Secondary | ICD-10-CM

## 2015-01-29 DIAGNOSIS — R635 Abnormal weight gain: Secondary | ICD-10-CM

## 2015-01-29 DIAGNOSIS — R102 Pelvic and perineal pain: Secondary | ICD-10-CM

## 2015-01-29 HISTORY — DX: Abnormal weight gain: R63.5

## 2015-01-29 HISTORY — DX: Pelvic and perineal pain: R10.2

## 2015-01-29 MED ORDER — NAPROXEN SODIUM 550 MG PO TABS: 550.0000 mg | ORAL_TABLET | Freq: Two times a day (BID) | ORAL | Status: DC

## 2015-01-29 MED ORDER — NORETHINDRONE 0.35 MG PO TABS: 1.0000 | ORAL_TABLET | Freq: Every day | ORAL | Status: DC

## 2015-01-29 NOTE — Patient Instructions (Signed)

## 2015-01-29 NOTE — Progress Notes (Signed)
Subjective:     Patient ID: Diane Shelton, female   DOB: Jul 15, 1996, 19 y.o.   MRN: [id number]  HPI Diane Shelton is a 19 year old white female in complaining of pelvic pain x 2 days, that comes and goes, has history of right ovarian cyst.She was on Micronor but stopped 3 months ago, had gained weight and periods still heavy.Period started yesterday with some clots.Has never had sex.Does have some nausea but not bad.Has history of migraines with aura.Has gained 20 lbs since September.  Review of Systems See HPI for positives, all others negative  Reviewed past medical,surgical, social and family history. Reviewed medications and allergies.     Objective:   Physical Exam BP 128/60 mmHg  Ht 5\' 8"  (1.727 m)  Wt 179 lb (81.194 kg)  BMI 27.22 kg/m2  LMP 01/28/2015   Skin warm and dry.Pelvic: external genitalia is normal in appearance no lesions, vagina:period like blood without odor,urethra has no lesions or masses noted, cervix:smooth, negative CMT, uterus: normal size, shape and contour, non tender, no masses felt, adnexa: no masses, some  Tenderness LLQ noted. Bladder is non tender and no masses felt. Discussed that need to close the ovaries down, talked about restarting POP, nexplanon,IUD, and depo as options and she will restart POP. Will schedule follow up US and check TSH. Declines nausea meds for now or narcotic for pain, will try NSAID for pain.  Assessment:     Pelvic pain Menorrhagia Weight gain    Plan:    Rx micronor take 1 daily with 11 refills, start Sunday  Rx anaprox DS # 30 1 bid with 1 refill Check TSH Return in 1 week for gyn US to assess ovaries for cysts, will talk when results back Review handouts on depo, skyla,liletta and nexplanon

## 2015-01-30 LAB — TSH: TSH: 2.13 u[IU]/mL (ref 0.450–4.500)

## 2015-02-03 ENCOUNTER — Telehealth: Payer: Self-pay | Admitting: Adult Health

## 2015-02-03 NOTE — Telephone Encounter (Signed)
Pt aware of TSH and feels better but still with occasional discomfort

## 2015-02-05 ENCOUNTER — Other Ambulatory Visit: Payer: BC Managed Care – PPO

## 2015-02-09 ENCOUNTER — Other Ambulatory Visit: Payer: BC Managed Care – PPO

## 2015-09-30 ENCOUNTER — Encounter (HOSPITAL_BASED_OUTPATIENT_CLINIC_OR_DEPARTMENT_OTHER): Payer: Self-pay

## 2015-09-30 ENCOUNTER — Emergency Department (HOSPITAL_BASED_OUTPATIENT_CLINIC_OR_DEPARTMENT_OTHER)
Admission: EM | Admit: 2015-09-30 | Discharge: 2015-09-30 | Disposition: A | Discharge status: Home / Self Care | Payer: BC Managed Care – PPO | Attending: Emergency Medicine | Admitting: Emergency Medicine

## 2015-09-30 DIAGNOSIS — R110 Nausea: Secondary | ICD-10-CM | POA: Insufficient documentation

## 2015-09-30 DIAGNOSIS — R51 Headache: Secondary | ICD-10-CM | POA: Diagnosis not present

## 2015-09-30 DIAGNOSIS — R55 Syncope and collapse: Secondary | ICD-10-CM | POA: Diagnosis present

## 2015-09-30 DIAGNOSIS — Z8742 Personal history of other diseases of the female genital tract: Secondary | ICD-10-CM | POA: Diagnosis not present

## 2015-09-30 DIAGNOSIS — Z3202 Encounter for pregnancy test, result negative: Secondary | ICD-10-CM | POA: Diagnosis not present

## 2015-09-30 DIAGNOSIS — Z8679 Personal history of other diseases of the circulatory system: Secondary | ICD-10-CM | POA: Diagnosis not present

## 2015-09-30 DIAGNOSIS — R0789 Other chest pain: Secondary | ICD-10-CM | POA: Diagnosis not present

## 2015-09-30 HISTORY — DX: Other ovarian cyst, unspecified side: N83.299

## 2015-09-30 LAB — URINALYSIS, ROUTINE W REFLEX MICROSCOPIC
Bilirubin Urine: NEGATIVE
GLUCOSE, UA: NEGATIVE mg/dL
Hgb urine dipstick: NEGATIVE
KETONES UR: NEGATIVE mg/dL
LEUKOCYTES UA: NEGATIVE
NITRITE: NEGATIVE
PROTEIN: NEGATIVE mg/dL
Specific Gravity, Urine: 1.014 (ref 1.005–1.030)
UROBILINOGEN UA: 0.2 mg/dL (ref 0.0–1.0)
pH: 5.5 (ref 5.0–8.0)

## 2015-09-30 LAB — CBC
HCT: 43.6 % (ref 36.0–46.0)
Hemoglobin: 14.3 g/dL (ref 12.0–15.0)
MCH: 28.4 pg (ref 26.0–34.0)
MCHC: 32.8 g/dL (ref 30.0–36.0)
MCV: 86.7 fL (ref 78.0–100.0)
Platelets: 304 10*3/uL (ref 150–400)
RBC: 5.03 MIL/uL (ref 3.87–5.11)
RDW: 14.1 % (ref 11.5–15.5)
WBC: 9.8 10*3/uL (ref 4.0–10.5)

## 2015-09-30 LAB — BASIC METABOLIC PANEL
Anion gap: 8 (ref 5–15)
BUN: 12 mg/dL (ref 6–20)
CHLORIDE: 105 mmol/L (ref 101–111)
CO2: 26 mmol/L (ref 22–32)
CREATININE: 0.96 mg/dL (ref 0.44–1.00)
Calcium: 9.5 mg/dL (ref 8.9–10.3)
GFR calc Af Amer: >60 mL/min (ref >60)
Glucose, Bld: 103 mg/dL — ABNORMAL HIGH (ref 65–99)
Potassium: 4 mmol/L (ref 3.5–5.1)
SODIUM: 139 mmol/L (ref 135–145)

## 2015-09-30 LAB — PREGNANCY, URINE: PREG TEST UR: NEGATIVE

## 2015-09-30 NOTE — Discharge Instructions (Signed)
Please follow up with a primary care provider from the Resource Guide provided below within the next week. Please return to the Emergency Department if symptoms worsen or new onset of fever, chest pain, difficulty breathing.

## 2015-09-30 NOTE — ED Provider Notes (Signed)
CSN: [id number]     Arrival date & time 09/30/15  1923 History   First MD Initiated Contact with Patient 09/30/15 2100     Chief Complaint  Patient presents with  . Loss of Consciousness     (Consider location/radiation/quality/duration/timing/severity/associated sxs/prior Treatment) HPI Comments: Patient is a 19 year old female who presents to the ED s/p syncopal episode, onset 10am. 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 Patient denies head injury. Endorses nausea earlier this afternoon that has since resolved. Endorses headache and chest tightness. Denies fever, chills, lightheadedness, dizziness, visual changes, difficulty breathing, cough, wheezing, CP, SOB, palpitations, abdominal pain, vomiting, numbness, tingling, weakness. Patient denies any prior histories of syncopal episodes. Denies any new medications.   Patient is a 19 y.o. female presenting with syncope.  Loss of Consciousness Associated symptoms: headaches and nausea     Past Medical History  Diagnosis Date  . Menorrhagia 02/27/2014  . RLQ abdominal pain 02/27/2014  . Migraines     with aura  . Other and unspecified ovarian cyst 03/07/2014  . Unspecified symptom associated with female genital organs 08/26/2014  . Pelvic pain in female 01/29/2015  . Weight gain 01/29/2015  . Physiological ovarian cysts    History reviewed. No pertinent past surgical history. Family History  Problem Relation Age of Onset  . Cancer Father     bladder  . Kidney failure Father   . Cancer Maternal Grandfather     brain tumor  . Alzheimer's disease Paternal Grandfather   . Congestive Heart Failure Maternal Grandmother   . Congestive Heart Failure Paternal Grandmother   . Cancer Maternal Aunt     breast   Social History  Substance Use Topics  . Smoking status: Never Smoker   .  Smokeless tobacco: Never Used  . Alcohol Use: No   OB History    Gravida Para Term Preterm AB TAB SAB Ectopic Multiple Living   0 0 0 0 0 0 0 0 0 0      Review of Systems  Respiratory: Positive for chest tightness.   Cardiovascular: Positive for syncope.  Gastrointestinal: Positive for nausea.  Neurological: Positive for syncope and headaches.  All other systems reviewed and are negative.     Allergies  Review of patient's allergies indicates no known allergies.  Home Medications   Prior to Admission medications   Not on File   BP 118/66 mmHg  Pulse 79  Temp(Src) 98 F (36.7 C) (Oral)  Resp 18  Ht 5\' 8"  (1.727 m)  Wt 184 lb (83.462 kg)  BMI 27.98 kg/m2  SpO2 100%  LMP 09/20/2015 Physical Exam  Constitutional: She is oriented to person, place, and time. She appears well-developed and well-nourished. No distress.  HENT:  Head: Normocephalic and atraumatic. Head is without raccoon's eyes, without Battle's sign, without abrasion, without contusion and without laceration.  Right Ear: Tympanic membrane normal.  Left Ear: Tympanic membrane normal.  Nose: Nose normal. Right sinus exhibits no maxillary sinus tenderness and no frontal sinus tenderness. Left sinus exhibits no maxillary sinus tenderness and no frontal sinus tenderness.  Mouth/Throat: Uvula is midline, oropharynx is clear and moist and mucous membranes are normal. No oropharyngeal exudate.  Eyes: Conjunctivae and EOM are normal. Pupils are equal, round, and reactive to light. Right eye exhibits no discharge. Left eye exhibits no  discharge. No scleral icterus.  Neck: Normal range of motion. Neck supple.  Cardiovascular: Normal rate, regular rhythm, normal heart sounds and intact distal pulses.   Pulmonary/Chest: Effort normal and breath sounds normal. No respiratory distress. She has no wheezes. She has no rales. She exhibits tenderness (Mid sternal chest wall TTP).  Abdominal: Soft. Bowel sounds are normal. She  exhibits no distension and no mass. There is no tenderness. There is no rebound and no guarding.  Musculoskeletal: Normal range of motion. She exhibits no edema or tenderness.  Lymphadenopathy:    She has no cervical adenopathy.  Neurological: She is alert and oriented to person, place, and time. She has normal strength and normal reflexes. No cranial nerve deficit or sensory deficit. Coordination and gait normal.  Skin: Skin is warm and dry.  Nursing note and vitals reviewed.   ED Course  Procedures (including critical care time) Labs Review Labs Reviewed  URINALYSIS, ROUTINE W REFLEX MICROSCOPIC (NOT AT [facility]) - Abnormal; Notable for the following:    APPearance CLOUDY (*)    All other components within normal limits  BASIC METABOLIC PANEL - Abnormal; Notable for the following:    Glucose, Bld 103 (*)    All other components within normal limits  PREGNANCY, URINE  CBC    Imaging Review No results found. I have personally reviewed and evaluated these images and lab results as part of my medical decision-making.   EKG Interpretation   Date/Time:  Wednesday September 30 2015 19:50:16 EDT Ventricular Rate:  90 PR Interval:  140 QRS Duration: 88 QT Interval:  376 QTC Calculation: 459 R Axis:   113 Text Interpretation:  Normal sinus rhythm with sinus arrhythmia Right axis  deviation Abnormal ECG No significant change since last tracing Confirmed  by YAO  MD, DAVID ([id number]) on 09/30/2015 8:27:42 PM     Filed Vitals:   09/30/15 2030  BP: 118/66  Pulse: 79  Temp:   Resp:     MDM   Final diagnoses:  Syncope, unspecified syncope type    Patient presents after a syncopal episode that occurred earlier this morning while 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 Denies head injury. Denies prior history of syncopal events. Endorses headache nausea and chest tightness that of since resolved. Exam unremarkable, cardiac exam benign. No neuro deficits. No signs of  trauma. EKG showed normal sinus rhythm. Labs unremarkable. Urinalysis unremarkable. Pregnancy negative. Negative orthostatics. I suspect syncopal episode likely due to vasovagal episode. I do not feel that any further workup or imaging is warranted at this time. Plan to discharge patient home. Patient given resource guide to follow up with primary care provider this week.   Evaluation does not show pathology requring ongoing emergent intervention or admission. Pt is hemodynamically stable and mentating appropriately. Discussed findings/results and plan with patient/guardian, who agrees with plan. All questions answered. Return precautions discussed and outpatient follow up given.      Satira Sark [place], [place] 09/30/15 2220  Richardean Canal, MD 10/01/15 [phone]

## 2015-09-30 NOTE — ED Notes (Signed)
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

## 2016-11-07 ENCOUNTER — Encounter (HOSPITAL_COMMUNITY): Payer: Self-pay | Admitting: Emergency Medicine

## 2016-11-07 ENCOUNTER — Emergency Department (HOSPITAL_COMMUNITY): Payer: BC Managed Care – PPO

## 2016-11-07 ENCOUNTER — Emergency Department (HOSPITAL_COMMUNITY)
Admission: EM | Admit: 2016-11-07 | Discharge: 2016-11-08 | Disposition: A | Discharge status: Home / Self Care | Payer: BC Managed Care – PPO | Attending: Emergency Medicine | Admitting: Emergency Medicine

## 2016-11-07 DIAGNOSIS — Y929 Unspecified place or not applicable: Secondary | ICD-10-CM | POA: Diagnosis not present

## 2016-11-07 DIAGNOSIS — S5002XA Contusion of left elbow, initial encounter: Secondary | ICD-10-CM | POA: Insufficient documentation

## 2016-11-07 DIAGNOSIS — Y939 Activity, unspecified: Secondary | ICD-10-CM | POA: Insufficient documentation

## 2016-11-07 DIAGNOSIS — Y999 Unspecified external cause status: Secondary | ICD-10-CM | POA: Diagnosis not present

## 2016-11-07 DIAGNOSIS — W231XXA Caught, crushed, jammed, or pinched between stationary objects, initial encounter: Secondary | ICD-10-CM | POA: Diagnosis not present

## 2016-11-07 DIAGNOSIS — Z79899 Other long term (current) drug therapy: Secondary | ICD-10-CM | POA: Diagnosis not present

## 2016-11-07 DIAGNOSIS — S50812A Abrasion of left forearm, initial encounter: Secondary | ICD-10-CM | POA: Diagnosis not present

## 2016-11-07 DIAGNOSIS — T1490XA Injury, unspecified, initial encounter: Secondary | ICD-10-CM

## 2016-11-07 DIAGNOSIS — M25522 Pain in left elbow: Secondary | ICD-10-CM

## 2016-11-07 DIAGNOSIS — S59902A Unspecified injury of left elbow, initial encounter: Secondary | ICD-10-CM | POA: Diagnosis present

## 2016-11-07 NOTE — ED Triage Notes (Signed)
Pt from home with complaints of arm pain and injury. Immediately prior to arrival, pt was trying to change her tire on her truck. Pt states that the jack came out and pt's arm got stuck between the truck and her spare tire. Pt has adequate radial pulse and minor digital movement. Pt has swelling and abrasions at the base of her elbow and has no ability to move her elbow.

## 2016-11-07 NOTE — ED Provider Notes (Signed)
[facility]-EMERGENCY DEPT Provider Note   CSN: [id number] Arrival date & time: 11/07/16  2316  By signing my name below, I, Doreatha Martin, attest that this documentation has been prepared under the direction and in the presence of [employer], PA-C. Electronically Signed: Doreatha Martin, ED Scribe. 11/07/16. 12:11 AM.    History   Chief Complaint Chief Complaint  Patient presents with  . Arm Injury    HPI Diane Shelton is a 20 y.o. female  who presents to the Emergency Department complaining of moderate elbow arm pain s/p injury that occurred just PTA. Pt states she was changing a tire and the jack slipped out from under the car, causing her left arm to become trapped between the spare tire on the ground and the back bumper of the car. She states she was able to pull the arm out without difficulty. She denies falls, head injury, LOC or additional injuries. Pt states her pain is worsened with elbow extension. Pt denies taking OTC medications at home to improve symptoms. She denies shoulder pain, numbness or weakness.    The history is provided by the patient. No language interpreter was used.    Past Medical History:  Diagnosis Date  . Menorrhagia 02/27/2014  . Migraines    with aura  . Other and unspecified ovarian cyst 03/07/2014  . Pelvic pain in female 01/29/2015  . Physiological ovarian cysts   . RLQ abdominal pain 02/27/2014  . Unspecified symptom associated with female genital organs 08/26/2014  . Weight gain 01/29/2015    Patient Active Problem List   Diagnosis Date Noted  . Pelvic pain in female 01/29/2015  . Weight gain 01/29/2015  . Unspecified symptom associated with female genital organs 08/26/2014  . Other and unspecified ovarian cyst 03/07/2014  . Menorrhagia 02/27/2014    History reviewed. No pertinent surgical history.  OB History    Gravida Para Term Preterm AB Living   0 0 0 0 0 0   SAB TAB Ectopic Multiple Live Births   0 0 0 0         Home Medications     Prior to Admission medications   Medication Sig Start Date End Date Taking? Authorizing Provider  HYDROcodone-acetaminophen (NORCO/VICODIN) 5-325 MG tablet Take 1 tablet by mouth every 4 (four) hours as needed for severe pain. 11/08/16   Antony Madura, PA-C  naproxen (NAPROSYN) 500 MG tablet Take 1 tablet (500 mg total) by mouth 2 (two) times daily. 11/08/16   Antony Madura, PA-C    Family History Family History  Problem Relation Age of Onset  . Cancer Father     bladder  . Kidney failure Father   . Cancer Maternal Grandfather     brain tumor  . Alzheimer's disease Paternal Grandfather   . Congestive Heart Failure Maternal Grandmother   . Congestive Heart Failure Paternal Grandmother   . Cancer Maternal Aunt     breast    Social History Social History  Substance Use Topics  . Smoking status: Never Smoker  . Smokeless tobacco: Never Used  . Alcohol use No     Allergies   Patient has no known allergies.   Review of Systems Review of Systems  Musculoskeletal: Positive for arthralgias.  Neurological: Negative for weakness and numbness.  All other systems reviewed and are negative.    Physical Exam Updated Vital Signs BP 131/75 (BP Location: Right Arm)   Pulse 90   Temp 97.7 F (36.5 C) (Oral)   Resp 16  Ht 5\' 9"  (1.753 m)   Wt 90.7 kg   LMP 10/07/2016   SpO2 100%   BMI 29.53 kg/m   Physical Exam  Constitutional: She is oriented to person, place, and time. She appears well-developed and well-nourished. No distress.  Nontoxic and in NAD  HENT:  Head: Normocephalic and atraumatic.  Eyes: Conjunctivae and EOM are normal. No scleral icterus.  Neck: Normal range of motion.  Cardiovascular: Normal rate, regular rhythm and intact distal pulses.   2+ distal radial pulse in the LUE. Capillary refill brisk in all digits of the left hand.  Pulmonary/Chest: Effort normal. No respiratory distress.  Respirations even and unlabored  Musculoskeletal:       Left elbow:  She exhibits decreased range of motion (decreased ROM of the left elbow with extension; this improved on repeat exam following Toradol and oral percocet) and swelling (mild). She exhibits no effusion and no deformity. Tenderness found.       Arms: Diffuse TTP to the lateral elbow. Abrasion noted. No effusion or crepitus. No bursitis. Compartments of the L forearm and LUE are soft. No pallor, poikilothermia, or paresthesias.  Neurological: She is alert and oriented to person, place, and time. She exhibits normal muscle tone. Coordination normal.  Grip strength 5/5 in the LUE. 5/5 strength with flexion and extension of the elbow against resistance. 5/5 strength with pronation and supination of the forearm against resistance. Sensation to light touch intact in the LUE.  Skin: Skin is warm and dry. No rash noted. She is not diaphoretic. No erythema. No pallor.  Psychiatric: She has a normal mood and affect. Her behavior is normal.  Nursing note and vitals reviewed.    ED Treatments / Results   DIAGNOSTIC STUDIES: Oxygen Saturation is 100% on RA, normal by my interpretation.    COORDINATION OF CARE: 12:07 AM Discussed treatment plan with pt at bedside which includes XR, orthopedic f/u and pt agreed to plan.    Labs (all labs ordered are listed, but only abnormal results are displayed) Labs Reviewed - No data to display  EKG  EKG Interpretation None       Radiology Dg Forearm Left  Result Date: 11/08/2016 CLINICAL DATA:  Injury, pain EXAM: LEFT FOREARM - 2 VIEW COMPARISON:  None. FINDINGS: There is no evidence of fracture or other focal bone lesions. Soft tissues are unremarkable. IMPRESSION: Negative. Electronically Signed   By: Jasmine PangKim  Fujinaga M.D.   On: 11/08/2016 00:09   Dg Humerus Left  Result Date: 11/08/2016 CLINICAL DATA:  Injury, pain EXAM: LEFT HUMERUS - 2+ VIEW COMPARISON:  None. FINDINGS: There is no evidence of fracture or other focal bone lesions. Soft tissues are  unremarkable. IMPRESSION: Negative. Electronically Signed   By: Jasmine PangKim  Fujinaga M.D.   On: 11/08/2016 00:09    Procedures Procedures (including critical care time)  Medications Ordered in ED Medications  ketorolac (TORADOL) injection 60 mg (60 mg Intramuscular Given 11/08/16 0019)  oxyCODONE-acetaminophen (PERCOCET/ROXICET) 5-325 MG per tablet 2 tablet (2 tablets Oral Given 11/08/16 0019)     Initial Impression / Assessment and Plan / ED Course  I have reviewed the triage vital signs and the nursing notes.  Pertinent labs & imaging results that were available during my care of the patient were reviewed by me and considered in my medical decision making (see chart for details).  Clinical Course     20 year old female with contusion to her left elbow. She is neurovascularly intact. Extension of the elbow improved after  management with IM Toradol and oral Percocet. Compartments soft. No bony deformity or crepitus. X-rays show no evidence of fracture or bony deformity. Patient referred to orthopedic hand surgery for outpatient follow-up. Patient given sling for comfort. Have recommended RICE and NSAIDs. Return precautions discussed and provided. Patient discharged in stable condition with no unaddressed concerns.   Final Clinical Impressions(s) / ED Diagnoses   Final diagnoses:  Elbow pain, left    New Prescriptions There are no discharge medications for this patient.   I personally performed the services described in this documentation, which was scribed in my presence. The recorded information has been reviewed and is accurate.      Antony MaduraKelly Alwin Lanigan, PA-C 11/08/16 0125    Cy BlamerApril Palumbo, MD 11/08/16 [phone]

## 2016-11-08 MED ORDER — HYDROCODONE-ACETAMINOPHEN 5-325 MG PO TABS: 1.0000 | ORAL_TABLET | ORAL | 0 refills | Status: DC | PRN

## 2016-11-08 MED ORDER — NAPROXEN 500 MG PO TABS: 500.0000 mg | ORAL_TABLET | Freq: Two times a day (BID) | ORAL | 0 refills | Status: DC

## 2016-11-08 MED ORDER — OXYCODONE-ACETAMINOPHEN 5-325 MG PO TABS
2.0000 | ORAL_TABLET | Freq: Once | ORAL | Status: AC
  Administered 2016-11-08: 2 via ORAL
  Filled 2016-11-08: qty 2

## 2016-11-08 MED ORDER — KETOROLAC TROMETHAMINE 60 MG/2ML IM SOLN
60.0000 mg | Freq: Once | INTRAMUSCULAR | Status: AC
  Administered 2016-11-08: 60 mg via INTRAMUSCULAR
  Filled 2016-11-08: qty 2

## 2016-11-08 NOTE — Discharge Instructions (Signed)
Keep your arm elevated as much as possible. Take Naproxen as prescribed for pain and swelling. Ice the area 3-4 times per day for 15-20 minutes each time for swelling. You may take Norco as prescribed for severe pain. Use a sling for support of your left arm and elbow. We advise that you call an orthopedic hand surgeon in the morning to schedule close follow up. Return to the ED if you have severe worsening of your pain. You should also return if you lose sensation or experience worsening tingling in your left hand, if your hand or lower arm becomes cold compared to your right hand, if you notice that your left hand or arm is pale compared to your right, or for any of the symptoms listed on the attached instructions.

## 2017-10-18 ENCOUNTER — Encounter (INDEPENDENT_AMBULATORY_CARE_PROVIDER_SITE_OTHER): Payer: Self-pay

## 2017-10-18 ENCOUNTER — Ambulatory Visit (INDEPENDENT_AMBULATORY_CARE_PROVIDER_SITE_OTHER): Payer: BLUE CROSS/BLUE SHIELD | Admitting: Adult Health

## 2017-10-18 ENCOUNTER — Encounter: Payer: Self-pay | Admitting: Adult Health

## 2017-10-18 VITALS — BP 107/70 | HR 83 | Ht 69.0 in | Wt 214.0 lb

## 2017-10-18 DIAGNOSIS — N920 Excessive and frequent menstruation with regular cycle: Secondary | ICD-10-CM

## 2017-10-18 DIAGNOSIS — R102 Pelvic and perineal pain: Secondary | ICD-10-CM

## 2017-10-18 DIAGNOSIS — N946 Dysmenorrhea, unspecified: Secondary | ICD-10-CM

## 2017-10-18 NOTE — Patient Instructions (Signed)
Endometriosis °Endometriosis is a condition in which the tissue that lines the uterus (endometrium) grows outside of its normal location. The tissue may grow in many locations close to the uterus, but it commonly grows on the ovaries, fallopian tubes, vagina, or bowel. When the uterus sheds the endometrium every menstrual cycle, there is bleeding wherever the endometrial tissue is located. This can cause pain because blood is irritating to tissues that are not normally exposed to it. °What are the causes? °The cause of endometriosis is not known. °What increases the risk? °You may be more likely to develop endometriosis if you: °· Have a family history of endometriosis. °· Have never given birth. °· Started your period at age 10 or younger. °· Have high levels of estrogen in your body. °· Were exposed to a certain medicine (diethylstilbestrol) before you were born (in utero). °· Had low birth weight. °· Were born as a twin, triplet, or other multiple. °· Have a BMI of less than 25. BMI is an estimate of body fat and is calculated from height and weight. ° °What are the signs or symptoms? °Often, there are no symptoms of this condition. If you do have symptoms, they may: °· Vary depending on where your endometrial tissue is growing. °· Occur during your menstrual period (most common) or midcycle. °· Come and go, or you may go months with no symptoms at all. °· Stop with menopause. ° °Symptoms may include: °· Pain in the back or abdomen. °· Heavier bleeding during periods. °· Pain during sex. °· Painful bowel movements. °· Infertility. °· Pelvic pain. °· Bleeding more than once a month. ° °How is this diagnosed? °This condition is diagnosed based on your symptoms and a physical exam. You may have tests, such as: °· Blood tests and urine tests. These may be done to help rule out other possible causes of your symptoms. °· Ultrasound, to look for abnormal tissues. °· An X-ray of the lower bowel (barium enema). °· An  ultrasound that is done through the vagina (transvaginally). °· CT scan. °· MRI. °· Laparoscopy. In this procedure, a lighted, pencil-sized instrument called a laparoscope is inserted into your abdomen through an incision. The laparoscope allows your health care provider to look at the organs inside your body and check for abnormal tissue to confirm the diagnosis. If abnormal tissue is found, your health care provider may remove a small piece of tissue (biopsy) to be examined under a microscope. ° °How is this treated? °Treatment for this condition may include: °· Medicines to relieve pain, such as NSAIDs. °· Hormone therapy. This involves using artificial (synthetic) hormones to reduce endometrial tissue growth. Your health care provider may recommend using a hormonal form of birth control, or other medicines. °· Surgery. This may be done to remove abnormal endometrial tissue. °? In some cases, tissue may be removed using a laparoscope and a laser (laparoscopic laser treatment). °? In severe cases, surgery may be done to remove the fallopian tubes, uterus, and ovaries (hysterectomy). ° °Follow these instructions at home: °· Take over-the-counter and prescription medicines only as told by your health care provider. °· Do not drive or use heavy machinery while taking prescription pain medicine. °· Try to avoid activities that cause pain, including sexual activity. °· Keep all follow-up visits as told by your health care provider. This is important. °Contact a health care provider if: °· You have pain in the area between your hip bones (pelvic area) that occurs: °? Before, during, or   after your period. °? In between your period and gets worse during your period. °? During or after sex. °? With bowel movements or urination, especially during your period. °· You have problems getting pregnant. °· You have a fever. °Get help right away if: °· You have severe pain that does not get better with medicine. °· You have severe  nausea and vomiting, or you cannot eat without vomiting. °· You have pain that affects only the lower, right side of your abdomen. °· You have abdominal pain that gets worse. °· You have abdominal swelling. °· You have blood in your stool. °This information is not intended to replace advice given to you by your health care provider. Make sure you discuss any questions you have with your health care provider. °Document Released: 12/02/2000 Document Revised: 09/09/2016 Document Reviewed: 05/07/2016 °Elsevier Interactive Patient Education © 2018 [employer] Inc[employer] ° ° °Pelvic Pain, Female °Pelvic pain is pain in your lower abdomen, below your belly button and between your hips. The pain may start suddenly (acute), keep coming back (recurring), or last a long time (chronic). Pelvic pain that lasts longer than six months is considered chronic. °Pelvic pain may affect your: °· Reproductive organs. °· Urinary system. °· Digestive tract. °· Musculoskeletal system. ° °There are many potential causes of pelvic pain. Sometimes, the pain can be a result of digestive or urinary conditions, strained muscles or ligaments, or even reproductive conditions. Sometimes the cause of pelvic pain is not known. °Follow these instructions at home: °· Take over-the-counter and prescription medicines only as told by your health care provider. °· Rest as told by your health care provider. °· Do not have sex it if hurts. °· Keep a journal of your pelvic pain. Write down: °? When the pain started. °? Where the pain is located. °? What seems to make the pain better or worse, such as food or your menstrual cycle. °? Any symptoms you have along with the pain. °· Keep all follow-up visits as told by your health care provider. This is important. °Contact a health care provider if: °· Medicine does not help your pain. °· Your pain comes back. °· You have new symptoms. °· You have abnormal vaginal discharge or bleeding, including bleeding after  menopause. °· You have a fever or chills. °· You are constipated. °· You have blood in your urine or stool. °· You have foul-smelling urine. °· You feel weak or lightheaded. °Get help right away if: °· You have sudden severe pain. °· Your pain gets steadily worse. °· You have severe pain along with fever, nausea, vomiting, or excessive sweating. °· You lose consciousness. °This information is not intended to replace advice given to you by your health care provider. Make sure you discuss any questions you have with your health care provider. °Document Released: 11/01/2004 Document Revised: 12/30/2015 Document Reviewed: 09/25/2015 °Elsevier Interactive Patient Education © 2018 Elsevier Inc. ° °

## 2017-10-18 NOTE — Progress Notes (Signed)
Subjective:     Patient ID: Diane Shelton, female   DOB: [birth year], 21 y.o.   MRN: [id number]  HPI [name] is a 21 year old white female in complaining of pelvic pain and heavy painful periods.periods last 5-7 days and she is wearing overnight pads.She has tried POP in past but not much help so stopped. Her job is physical and pain is so bad at least once a month that she may miss work, or at least want to.   Review of Systems Pelvic pain Heavy periods  Painful periods Has never had sex Reviewed past medical,surgical, social and family history. Reviewed medications and allergies.     Objective:   Physical Exam BP 107/70 (BP Location: Right Arm, Patient Position: Sitting, Cuff Size: Normal)   Pulse 83   Ht 5\' 9"  (1.753 m)   Wt 214 lb (97.1 kg)   LMP 10/11/2017   BMI 31.60 kg/m  Skin warm and dry. Neck: mid line trachea, normal thyroid, good ROM, no lymphadenopathy noted. Lungs: clear to ausculation bilaterally. Cardiovascular: regular rate and rhythm. PHQ 2 score 0.Discussed getting US first then pap and physical and trying megace to stop periods, and if not better, see Dr Despina HiddenEure for possible laparascopic surgery.She says she is ready to have it all removed.    Assessment:     1. Pelvic pain   2. Dysmenorrhea   3. Menorrhagia with regular cycle       Plan:     Return in 1 week for GYN US and then see me 2-3 days later for pap and physical  Review handouts on pelvic pain and endometriosis

## 2017-10-25 ENCOUNTER — Ambulatory Visit (INDEPENDENT_AMBULATORY_CARE_PROVIDER_SITE_OTHER): Payer: BLUE CROSS/BLUE SHIELD

## 2017-10-25 ENCOUNTER — Other Ambulatory Visit: Payer: Self-pay | Admitting: Adult Health

## 2017-10-25 DIAGNOSIS — N83202 Unspecified ovarian cyst, left side: Secondary | ICD-10-CM

## 2017-10-25 DIAGNOSIS — N920 Excessive and frequent menstruation with regular cycle: Secondary | ICD-10-CM

## 2017-10-25 DIAGNOSIS — R102 Pelvic and perineal pain: Secondary | ICD-10-CM | POA: Diagnosis not present

## 2017-10-25 DIAGNOSIS — N946 Dysmenorrhea, unspecified: Secondary | ICD-10-CM | POA: Diagnosis not present

## 2017-10-25 NOTE — Progress Notes (Signed)
PELVIC US TA/TV: homogeneous retroflexed uterus,wnl,EEC 10.8 mm,normal right ovary,three left complex ovarian cysts w/low level echoes (?endometrioma) w/arterial and venous flow(#1) 3 x 3.4 x 2.6 cm,(#2) 2.2 x 1.9 x 2.4 cm,(#3) 2.5 x 2 x 2.1 cm,no pain during ultrasound,ovaries appear mobile

## 2017-10-27 ENCOUNTER — Other Ambulatory Visit (HOSPITAL_COMMUNITY)
Admission: RE | Admit: 2017-10-27 | Discharge: 2017-10-27 | Disposition: A | Discharge status: Home / Self Care | Payer: BC Managed Care – PPO | Source: Ambulatory Visit | Attending: Adult Health | Admitting: Adult Health

## 2017-10-27 ENCOUNTER — Encounter: Payer: Self-pay | Admitting: Adult Health

## 2017-10-27 ENCOUNTER — Ambulatory Visit (INDEPENDENT_AMBULATORY_CARE_PROVIDER_SITE_OTHER): Payer: BLUE CROSS/BLUE SHIELD | Admitting: Adult Health

## 2017-10-27 VITALS — BP 100/60 | HR 98 | Ht 69.0 in | Wt 216.0 lb

## 2017-10-27 DIAGNOSIS — N946 Dysmenorrhea, unspecified: Secondary | ICD-10-CM | POA: Diagnosis not present

## 2017-10-27 DIAGNOSIS — N920 Excessive and frequent menstruation with regular cycle: Secondary | ICD-10-CM | POA: Diagnosis not present

## 2017-10-27 DIAGNOSIS — Z01419 Encounter for gynecological examination (general) (routine) without abnormal findings: Secondary | ICD-10-CM | POA: Diagnosis not present

## 2017-10-27 DIAGNOSIS — R102 Pelvic and perineal pain: Secondary | ICD-10-CM | POA: Diagnosis not present

## 2017-10-27 DIAGNOSIS — N83202 Unspecified ovarian cyst, left side: Secondary | ICD-10-CM

## 2017-10-27 DIAGNOSIS — Z01411 Encounter for gynecological examination (general) (routine) with abnormal findings: Secondary | ICD-10-CM

## 2017-10-27 MED ORDER — MEGESTROL ACETATE 40 MG PO TABS: 40.0000 mg | ORAL_TABLET | Freq: Every day | ORAL | 3 refills | Status: DC

## 2017-10-27 NOTE — Patient Instructions (Signed)
Ovarian Cyst An ovarian cyst is a fluid-filled sac that forms on an ovary. The ovaries are small organs that produce eggs in women. Various types of cysts can form on the ovaries. Some may cause symptoms and require treatment. Most ovarian cysts go away on their own, are not cancerous (are benign), and do not cause problems. Common types of ovarian cysts include:  Functional (follicle) cysts. ? Occur during the menstrual cycle, and usually go away with the next menstrual cycle if you do not get pregnant. ? Usually cause no symptoms.  Endometriomas. ? Are cysts that form from the tissue that lines the uterus (endometrium). ? Are sometimes called "chocolate cysts" because they become filled with blood that turns brown. ? Can cause pain in the lower abdomen during intercourse and during your period.  Cystadenoma cysts. ? Develop from cells on the outside surface of the ovary. ? Can get very large and cause lower abdomen pain and pain with intercourse. ? Can cause severe pain if they twist or break open (rupture).  Dermoid cysts. ? Are sometimes found in both ovaries. ? May contain different kinds of body tissue, such as skin, teeth, hair, or cartilage. ? Usually do not cause symptoms unless they get very big.  Theca lutein cysts. ? Occur when too much of a certain hormone (human chorionic gonadotropin) is produced and overstimulates the ovaries to produce an egg. ? Are most common after having procedures used to assist with the conception of a baby (in vitro fertilization).  What are the causes? Ovarian cysts may be caused by:  Ovarian hyperstimulation syndrome. This is a condition that can develop from taking fertility medicines. It causes multiple large ovarian cysts to form.  Polycystic ovarian syndrome (PCOS). This is a common hormonal disorder that can cause ovarian cysts, as well as problems with your period or fertility.  What increases the risk? The following factors may make  you more likely to develop ovarian cysts:  Being overweight or obese.  Taking fertility medicines.  Taking certain forms of hormonal birth control.  Smoking.  What are the signs or symptoms? Many ovarian cysts do not cause symptoms. If symptoms are present, they may include:  Pelvic pain or pressure.  Pain in the lower abdomen.  Pain during sex.  Abdominal swelling.  Abnormal menstrual periods.  Increasing pain with menstrual periods.  How is this diagnosed? These cysts are commonly found during a routine pelvic exam. You may have tests to find out more about the cyst, such as:  Ultrasound.  X-ray of the pelvis.  CT scan.  MRI.  Blood tests.  How is this treated? Many ovarian cysts go away on their own without treatment. Your health care provider may want to check your cyst regularly for 2-3 months to see if it changes. If you are in menopause, it is especially important to have your cyst monitored closely because menopausal women have a higher rate of ovarian cancer. When treatment is needed, it may include:  Medicines to help relieve pain.  A procedure to drain the cyst (aspiration).  Surgery to remove the whole cyst.  Hormone treatment or birth control pills. These methods are sometimes used to help dissolve a cyst.  Follow these instructions at home:  Take over-the-counter and prescription medicines only as told by your health care provider.  Do not drive or use heavy machinery while taking prescription pain medicine.  Get regular pelvic exams and Pap tests as often as told by your health care   provider.  Return to your normal activities as told by your health care provider. Ask your health care provider what activities are safe for you.  Do not use any products that contain nicotine or tobacco, such as cigarettes and e-cigarettes. If you need help quitting, ask your health care provider.  Keep all follow-up visits as told by your health care provider.  This is important. Contact a health care provider if:  Your periods are late, irregular, or painful, or they stop.  You have pelvic pain that does not go away.  You have pressure on your bladder or trouble emptying your bladder completely.  You have pain during sex.  You have any of the following in your abdomen: ? A feeling of fullness. ? Pressure. ? Discomfort. ? Pain that does not go away. ? Swelling.  You feel generally ill.  You become constipated.  You lose your appetite.  You develop severe acne.  You start to have more body hair and facial hair.  You are gaining weight or losing weight without changing your exercise and eating habits.  You think you may be pregnant. Get help right away if:  You have abdominal pain that is severe or gets worse.  You cannot eat or drink without vomiting.  You suddenly develop a fever.  Your menstrual period is much heavier than usual. This information is not intended to replace advice given to you by your health care provider. Make sure you discuss any questions you have with your health care provider. Document Released: 12/05/2005 Document Revised: 06/24/2016 Document Reviewed: 05/08/2016 Elsevier Interactive Patient Education  2018 [employer] Inc.  

## 2017-10-27 NOTE — Addendum Note (Signed)
Addended by: Federico FlakeNES, PEGGY A on: 10/27/2017 12:46 PM   Modules accepted: Orders

## 2017-10-27 NOTE — Progress Notes (Signed)
Patient ID: Diane Shelton, female   DOB: Jan 18, 1996, 21 y.o.   MRN: [id number] History of Present Illness: Diane Shelton is a 21 year old white female in for well woman gyn exam and first pap.   Current Medications, Allergies, Past Medical History, Past Surgical History, Family History and Social History were reviewed in [facility] electronic medical record.     Review of Systems: Patient denies any daily headaches, hearing loss, fatigue, blurred vision, shortness of breath, chest pain, abdominal pain, problems with bowel movements, urination, or intercourse(not having sex). No joint pain or mood swings. +pain with periods and heavy periods +LLQ pain  Physical Exam:BP 100/60 (BP Location: Left Arm, Patient Position: Sitting, Cuff Size: Large)   Pulse 98   Ht 5\' 9"  (1.753 m)   Wt 216 lb (98 kg)   LMP 10/11/2017   BMI 31.90 kg/m  General:  Well developed, well nourished, no acute distress Skin:  Warm and dry Neck:  Midline trachea, normal thyroid, good ROM, no lymphadenopathy Lungs; Clear to auscultation bilaterally Breast:  No dominant palpable mass, retraction, or nipple discharge Cardiovascular: Regular rate and rhythm Abdomen:  Soft, non tender, no hepatosplenomegaly Pelvic:  External genitalia is normal in appearance, no lesions.  The vagina is normal in appearance. Urethra has no lesions or masses. The cervix is nulliparous, pap with CG/CHL performed.  Uterus is felt to be normal size, shape, and contour.  No adnexal masses, +LLQtenderness noted.Bladder is non tender, no masses felt. Extremities/musculoskeletal:  No swelling or varicosities noted, no clubbing or cyanosis Psych:  No mood changes, alert and cooperative,seems happy Reviewed US with pt, she has 3 complex cysts left ovary, ? Endometriomas, discussed with Dr Despina HiddenEure, will try megace to suppress ovaries and repeat US in 6 weeks.She is aware surgical removal is option.   Impression: 1. Encounter for gynecological examination  with Papanicolaou smear of cervix   2. Pelvic pain   3. Dysmenorrhea   4. Menorrhagia with regular cycle   5. Cyst of left ovary       Plan: Meds ordered this encounter  Medications  . megestrol (MEGACE) 40 MG tablet    Sig: Take 1 tablet (40 mg total) daily by mouth.    Dispense:  30 tablet    Refill:  3    Order Specific Question:   Supervising Provider    Answer:   Lazaro ArmsEURE, LUTHER H [[id number]]  Review handout on ovarian cyst Return in 6 weeks for GYN US and see me Physical in 1 year Pap in 3 if normal

## 2017-10-30 LAB — CYTOLOGY - PAP
ADEQUACY: ABSENT
CHLAMYDIA, DNA PROBE: NEGATIVE
DIAGNOSIS: NEGATIVE
Neisseria Gonorrhea: NEGATIVE

## 2017-11-20 ENCOUNTER — Telehealth: Payer: Self-pay | Admitting: Adult Health

## 2017-11-20 MED ORDER — NAPROXEN 500 MG PO TABS: 500.0000 mg | ORAL_TABLET | Freq: Two times a day (BID) | ORAL | 0 refills | Status: DC

## 2017-11-20 MED ORDER — MEGESTROL ACETATE 40 MG PO TABS: 40.0000 mg | ORAL_TABLET | Freq: Two times a day (BID) | ORAL | 3 refills | Status: DC

## 2017-11-20 NOTE — Telephone Encounter (Signed)
Left message will increase megace to 2 daily and refilled naproxen

## 2017-11-20 NOTE — Telephone Encounter (Signed)
Patient called stating she is still bleeding but not as heavy. She is still having some stabbing pain in her vaginal area. Wants to know if she should still expect bleeding like a period or if this was not the idea with her taking the Megace. Also states Ibuprofen does not help as well with her pain and would like Naproxen refill if possible. Please advise.

## 2017-11-20 NOTE — Telephone Encounter (Signed)
LMOVM to return call if desired.

## 2017-11-20 NOTE — Telephone Encounter (Signed)
Patient called stating that Victorino DikeJennifer has placed her on a medication and she has some question regarding the medication. Please contact pt

## 2017-12-07 ENCOUNTER — Other Ambulatory Visit: Payer: Self-pay | Admitting: Adult Health

## 2017-12-07 DIAGNOSIS — N83202 Unspecified ovarian cyst, left side: Secondary | ICD-10-CM

## 2017-12-08 ENCOUNTER — Ambulatory Visit (INDEPENDENT_AMBULATORY_CARE_PROVIDER_SITE_OTHER): Payer: BLUE CROSS/BLUE SHIELD

## 2017-12-08 ENCOUNTER — Ambulatory Visit (INDEPENDENT_AMBULATORY_CARE_PROVIDER_SITE_OTHER): Payer: BLUE CROSS/BLUE SHIELD | Admitting: Adult Health

## 2017-12-08 ENCOUNTER — Encounter: Payer: Self-pay | Admitting: Adult Health

## 2017-12-08 ENCOUNTER — Telehealth: Payer: Self-pay | Admitting: *Deleted

## 2017-12-08 VITALS — BP 100/60 | HR 83 | Ht 69.0 in | Wt 211.0 lb

## 2017-12-08 DIAGNOSIS — N939 Abnormal uterine and vaginal bleeding, unspecified: Secondary | ICD-10-CM

## 2017-12-08 DIAGNOSIS — N83202 Unspecified ovarian cyst, left side: Secondary | ICD-10-CM | POA: Diagnosis not present

## 2017-12-08 NOTE — Progress Notes (Signed)
Subjective:     Patient ID: Diane Shelton, female   DOB: 04-18-1996, 21 y.o.   MRN: [id number]  HPI Diane Shelton is a 21 year old white female, in for US to assess left ovarian cysts.She is taking 2 megace a day and bleeding has stopped and pelvic pain is better.  Review of Systems Bleeding stopped Pain better Reviewed past medical,surgical, social and family history. Reviewed medications and allergies.     Objective:   Physical Exam BP 100/60 (BP Location: Left Arm, Patient Position: Sitting, Cuff Size: Large)   Pulse 83   Ht 5\' 9"  (1.753 m)   Wt 211 lb (95.7 kg)   LMP 11/16/2017   BMI 31.16 kg/m Reviewed WU:JWJXBJS:uterus WNL, EEC 5.3 mm and has 3 complex left ovarian cysts that are smaller.(1)2.2 x1.5 x 2.1 cm, (2) 1.7 x 1.6 x 1.5 cm, (3) 1.6 x 1.5 x . 8 cm. Will continue megace and recheck US in 8 weeks, if not better will refer to Dr Despina HiddenEure for surgical consult.    Assessment:     1. Cyst of left ovary       Plan:     Return in 8 weeks for F/U US Continue megace

## 2017-12-08 NOTE — Progress Notes (Signed)
PELVIC US TA/TV: homogeneous retroflexed uterus,wnl,EEC 5.3 mm,normal right ovary,three complex left ovarian cysts w/low level echos,decreased in size from previous ultrasound,(#1) 2.2 x 2.5 x 2.1 cm (#2) 1.7 x 1.5 x 1.6 cm,(#3) 1.6 x 1.5 x .8 cm,no free fluid,ovaries appear mobile,left adnexal pain during ultrasound

## 2017-12-08 NOTE — Telephone Encounter (Signed)
Note completed. Left at front desk.

## 2018-02-02 ENCOUNTER — Ambulatory Visit: Payer: BLUE CROSS/BLUE SHIELD | Admitting: Adult Health

## 2018-02-02 ENCOUNTER — Other Ambulatory Visit: Payer: BLUE CROSS/BLUE SHIELD

## 2018-02-02 ENCOUNTER — Encounter: Payer: Self-pay | Admitting: *Deleted

## 2018-02-26 ENCOUNTER — Other Ambulatory Visit: Payer: Self-pay | Admitting: Adult Health

## 2018-02-26 MED ORDER — MEGESTROL ACETATE 40 MG PO TABS: 40.0000 mg | ORAL_TABLET | Freq: Two times a day (BID) | ORAL | 0 refills | Status: DC

## 2018-02-26 NOTE — Progress Notes (Signed)
Refilled megace  

## 2018-06-06 ENCOUNTER — Other Ambulatory Visit: Payer: Self-pay | Admitting: Adult Health

## 2018-08-22 ENCOUNTER — Telehealth: Payer: Self-pay | Admitting: Adult Health

## 2018-08-22 NOTE — Telephone Encounter (Signed)
Patient states she is experiencing some side effects since taking the Megace of an ovarian cyst.  Says she is having mood swings, insomnia and feelings of depression. No thoughts of hurting herself just "in a weird place".  She is scheduled for an U/S and visit on 09/07/18. Please advise.

## 2018-08-23 NOTE — Telephone Encounter (Signed)
Left message to  meds til after [place] and keep appt with me 9/20, unless feels like needs to do something emergently

## 2018-09-07 ENCOUNTER — Ambulatory Visit (INDEPENDENT_AMBULATORY_CARE_PROVIDER_SITE_OTHER): Payer: BC Managed Care – PPO

## 2018-09-07 ENCOUNTER — Ambulatory Visit: Payer: BC Managed Care – PPO | Admitting: Adult Health

## 2018-09-07 ENCOUNTER — Encounter: Payer: Self-pay | Admitting: Adult Health

## 2018-09-07 VITALS — BP 122/71 | HR 85 | Ht 69.0 in | Wt 215.5 lb

## 2018-09-07 DIAGNOSIS — N83202 Unspecified ovarian cyst, left side: Secondary | ICD-10-CM

## 2018-09-07 DIAGNOSIS — R102 Pelvic and perineal pain: Secondary | ICD-10-CM

## 2018-09-07 NOTE — Progress Notes (Signed)
  Subjective:     Patient ID: Diane Shelton, female   DOB: [birth year], 22 y.o.   MRN: [id number]  HPI Diane Shelton is a 22 year old white female in for US, has had ovarian cyst in past, and is on megace 80 mg daily.She is not sexually active. 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   Review of Systems +pelvic pain\  Reviewed past medical,surgical, social and family history. Reviewed medications and allergies.     Objective:   Physical Exam BP 122/71 (BP Location: Left Arm, Patient Position: Sitting, Cuff Size: Large)   Pulse 85   Ht 5\' 9"  (1.753 m)   Wt 215 lb 8 oz (97.8 kg)   BMI 31.82 kg/m   US showed left dominate complex follicle 1.5 x 1.7 x 1.6 cm on left.It was 3 in December 2018.     Assessment:     1. Cyst of left ovary   2. Pelvic pain       Plan:     Continue megace  F/U with Dr Despina HiddenEure in 1 week

## 2018-09-07 NOTE — Progress Notes (Signed)
PELVIC US TA/TV:homogeneous retroflexed uterus,wnl,EEC 3 mm,normal right ovary,complex dominate left follicle 1.6 x 1.5 x 1.7 cm,ovaries appear mobile,no free fluid,no pain during ultrasound

## 2018-09-20 ENCOUNTER — Ambulatory Visit: Payer: BC Managed Care – PPO | Admitting: Obstetrics & Gynecology

## 2018-09-28 ENCOUNTER — Ambulatory Visit: Payer: BC Managed Care – PPO | Admitting: Obstetrics & Gynecology

## 2018-10-06 ENCOUNTER — Other Ambulatory Visit: Payer: Self-pay

## 2018-10-10 ENCOUNTER — Other Ambulatory Visit: Payer: Self-pay | Admitting: Adult Health

## 2018-10-10 MED ORDER — MEGESTROL ACETATE 40 MG PO TABS: 40.0000 mg | ORAL_TABLET | Freq: Two times a day (BID) | ORAL | 0 refills | Status: DC

## 2018-10-10 MED ORDER — NAPROXEN 500 MG PO TABS: 500.0000 mg | ORAL_TABLET | ORAL | 3 refills | Status: DC | PRN

## 2018-10-10 NOTE — Progress Notes (Signed)
refilled megace and naprosyn

## 2018-10-22 ENCOUNTER — Ambulatory Visit: Payer: BC Managed Care – PPO | Admitting: Obstetrics & Gynecology

## 2018-10-22 ENCOUNTER — Encounter: Payer: Self-pay | Admitting: Obstetrics & Gynecology

## 2018-10-22 VITALS — BP 125/79 | HR 88 | Ht 69.0 in | Wt 217.0 lb

## 2018-10-22 DIAGNOSIS — N946 Dysmenorrhea, unspecified: Secondary | ICD-10-CM | POA: Diagnosis not present

## 2018-10-22 DIAGNOSIS — N83202 Unspecified ovarian cyst, left side: Secondary | ICD-10-CM | POA: Diagnosis not present

## 2018-10-22 DIAGNOSIS — N920 Excessive and frequent menstruation with regular cycle: Secondary | ICD-10-CM | POA: Diagnosis not present

## 2018-10-22 MED ORDER — PROMETHAZINE HCL 25 MG PO TABS: 25.0000 mg | ORAL_TABLET | Freq: Four times a day (QID) | ORAL | 1 refills | Status: DC | PRN

## 2018-10-22 NOTE — Progress Notes (Signed)
Follow up appointment for results  Chief Complaint  Patient presents with  . Ovarian Cyst    left side    Blood pressure 125/79, pulse 88, height 5\' 9"  (1.753 m), weight 217 lb (98.4 kg).  GYNECOLOGIC SONOGRAM   Diane Shelton is a 22 y.o. G0P0000 unknown LMP,she is here for a pelvic sonogram to f/u left ovarian cysts.  Uterus                      5.7 x 3.4 x 4.2 cm, vol 42 ml,homogeneous retroflexed uterus,wnl  Endometrium          3 mm, symmetrical, wnl  Right ovary             3.7 x 2.2 x 2.4 cm, wnl  Left ovary                3.3 x 2.2 x 2.2 cm, complex dominate left follicle 1.6 x 1.5 x 1.7 cm  No free fluid   Technician Comments:  PELVIC US TA/TV:homogeneous retroflexed uterus,wnl,EEC 3 mm,normal right ovary,complex dominate left follicle 1.6 x 1.5 x 1.7 cm,ovaries appear mobile,no free fluid,no pain during ultrasound    [employer] 09/07/2018 12:17 PM  Clinical Impression and recommendations:  I have reviewed the sonogram results above, and compared to current sonographic appearance with 2018 photos of that ultrasound  Combined with the patient's current clinical course, below are my impressions and any appropriate recommendations for management based on the sonographic findings:  1.  Left ovary is significantly smaller at 8 cc ovarian volume compared to 17 cc last year #2 right ovary completely within normal limits 3 retroverted uterus small without any abnormalities noted  Diane Shelton  Pain is suggestive of ureteral calculus not 1.6 cm ovarian corpus luteum Pain lasted 5 hours sharp sweaty nausea then completely resolved, no free fluid on sonogram the next day  MEDS ordered this encounter: Meds ordered this encounter  Medications  . promethazine (PHENERGAN) 25 MG tablet    Sig: Take 1 tablet (25 mg total) by mouth every 6 (six) hours as needed for nausea or vomiting.    Dispense:  30 tablet    Refill:  1    Orders for this  encounter: No orders of the defined types were placed in this encounter.   Impression: >Acute LLQ pain suggestive of ureteral calculus, FH + from Father   Plan: Phenergan for ureteral relaxation if has another attack Continue megestrol for her cycle management  Follow Up: Return if symptoms worsen or fail to improve.       Face to face time:  15 minutes  Greater than 50% of the visit time was spent in counseling and coordination of care with the patient.  The summary and outline of the counseling and care coordination is summarized in the note above.   All questions were answered.  Past Medical History:  Diagnosis Date  . Menorrhagia 02/27/2014  . Migraines    with aura  . Other and unspecified ovarian cyst 03/07/2014  . Pelvic pain in female 01/29/2015  . Physiological ovarian cysts   . RLQ abdominal pain 02/27/2014  . Unspecified symptom associated with female genital organs 08/26/2014  . Weight gain 01/29/2015    History reviewed. No pertinent surgical history.  OB History    Gravida  0   Para  0   Term  0   Preterm  0   AB  0   Living  0     SAB  0   TAB  0   Ectopic  0   Multiple  0   Live Births              No Known Allergies  Social History   Socioeconomic History  . Marital status: Single    Spouse name: Not on file  . Number of children: Not on file  . Years of education: Not on file  . Highest education level: Not on file  Occupational History  . Not on file  Social Needs  . Financial resource strain: Not on file  . Food insecurity:    Worry: Not on file    Inability: Not on file  . Transportation needs:    Medical: Not on file    Non-medical: Not on file  Tobacco Use  . Smoking status: Current Some Day Smoker    Types: Cigarettes  . Smokeless tobacco: Never Used  . Tobacco comment: smokes when drinks  Substance and Sexual Activity  . Alcohol use: Yes    Comment: occac  . Drug use: No  . Sexual activity: Never     Birth control/protection: None  Lifestyle  . Physical activity:    Days per week: Not on file    Minutes per session: Not on file  . Stress: Not on file  Relationships  . Social connections:    Talks on phone: Not on file    Gets together: Not on file    Attends religious service: Not on file    Active member of club or organization: Not on file    Attends meetings of clubs or organizations: Not on file    Relationship status: Not on file  Other Topics Concern  . Not on file  Social History Narrative  . Not on file    Family History  Problem Relation Age of Onset  . Cancer Father        bladder  . Kidney failure Father   . Kidney cancer Father   . Cancer Maternal Grandfather        brain tumor  . Alzheimer's disease Paternal Grandfather   . Congestive Heart Failure Maternal Grandmother   . Congestive Heart Failure Paternal Grandmother   . Cancer Maternal Aunt        breast  . Breast cancer Maternal Aunt   . Breast cancer Maternal Aunt

## 2018-11-19 ENCOUNTER — Encounter: Payer: BC Managed Care – PPO | Admitting: Adult Health

## 2019-01-03 ENCOUNTER — Other Ambulatory Visit: Payer: Self-pay | Admitting: Adult Health

## 2019-03-24 ENCOUNTER — Other Ambulatory Visit: Payer: Self-pay | Admitting: Adult Health

## 2019-04-24 ENCOUNTER — Telehealth: Payer: Self-pay | Admitting: Adult Health

## 2019-04-24 MED ORDER — PAROXETINE HCL 10 MG PO TABS: 10.0000 mg | ORAL_TABLET | Freq: Every day | ORAL | 3 refills | Status: DC

## 2019-04-24 NOTE — Telephone Encounter (Signed)
Pt says she has mood swings, feels depressed at times. 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

## 2019-04-24 NOTE — Telephone Encounter (Signed)
Patient called, requested that [facility] call her back.  I asked what this was regarding, she stated that she wanted to chat with her for a minute.  I asked if she had mychart, she does so I suggested that she send her a message in there.  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    [phone]

## 2019-05-16 ENCOUNTER — Other Ambulatory Visit: Payer: Self-pay | Admitting: Adult Health

## 2019-06-16 ENCOUNTER — Other Ambulatory Visit: Payer: Self-pay | Admitting: Adult Health

## 2019-09-20 ENCOUNTER — Other Ambulatory Visit: Payer: Self-pay | Admitting: Adult Health

## 2020-04-29 ENCOUNTER — Encounter (HOSPITAL_COMMUNITY): Payer: Self-pay | Admitting: Emergency Medicine

## 2020-04-29 ENCOUNTER — Other Ambulatory Visit: Payer: Self-pay

## 2020-04-29 ENCOUNTER — Inpatient Hospital Stay (HOSPITAL_COMMUNITY)
Admission: EM | Admit: 2020-04-29 | Discharge: 2020-05-05 | DRG: 177 | Disposition: A | Discharge status: Home / Self Care | Payer: BC Managed Care – PPO | Attending: Internal Medicine | Admitting: Internal Medicine

## 2020-04-29 ENCOUNTER — Emergency Department (HOSPITAL_COMMUNITY): Payer: BC Managed Care – PPO

## 2020-04-29 DIAGNOSIS — E6609 Other obesity due to excess calories: Secondary | ICD-10-CM | POA: Diagnosis not present

## 2020-04-29 DIAGNOSIS — E669 Obesity, unspecified: Secondary | ICD-10-CM

## 2020-04-29 DIAGNOSIS — U071 COVID-19: Principal | ICD-10-CM

## 2020-04-29 DIAGNOSIS — F17210 Nicotine dependence, cigarettes, uncomplicated: Secondary | ICD-10-CM | POA: Diagnosis present

## 2020-04-29 DIAGNOSIS — G43909 Migraine, unspecified, not intractable, without status migrainosus: Secondary | ICD-10-CM | POA: Diagnosis present

## 2020-04-29 DIAGNOSIS — J1282 Pneumonia due to coronavirus disease 2019: Secondary | ICD-10-CM

## 2020-04-29 DIAGNOSIS — Z6835 Body mass index (BMI) 35.0-35.9, adult: Secondary | ICD-10-CM | POA: Diagnosis not present

## 2020-04-29 DIAGNOSIS — E876 Hypokalemia: Secondary | ICD-10-CM | POA: Diagnosis present

## 2020-04-29 DIAGNOSIS — E860 Dehydration: Secondary | ICD-10-CM | POA: Diagnosis present

## 2020-04-29 DIAGNOSIS — J9601 Acute respiratory failure with hypoxia: Secondary | ICD-10-CM | POA: Diagnosis present

## 2020-04-29 DIAGNOSIS — Z79899 Other long term (current) drug therapy: Secondary | ICD-10-CM

## 2020-04-29 HISTORY — DX: COVID-19: U07.1

## 2020-04-29 HISTORY — DX: Other specified health status: Z78.9

## 2020-04-29 LAB — CBC WITH DIFFERENTIAL/PLATELET
Abs Immature Granulocytes: 0.05 10*3/uL (ref 0.00–0.07)
Basophils Absolute: 0 10*3/uL (ref 0.0–0.1)
Basophils Relative: 0 %
Eosinophils Absolute: 0 10*3/uL (ref 0.0–0.5)
Eosinophils Relative: 0 %
HCT: 48.8 % — ABNORMAL HIGH (ref 36.0–46.0)
Hemoglobin: 15.4 g/dL — ABNORMAL HIGH (ref 12.0–15.0)
Immature Granulocytes: 1 %
Lymphocytes Relative: 22 %
Lymphs Abs: 1.1 10*3/uL (ref 0.7–4.0)
MCH: 28.2 pg (ref 26.0–34.0)
MCHC: 31.6 g/dL (ref 30.0–36.0)
MCV: 89.2 fL (ref 80.0–100.0)
Monocytes Absolute: 0.3 10*3/uL (ref 0.1–1.0)
Monocytes Relative: 7 %
Neutro Abs: 3.5 10*3/uL (ref 1.7–7.7)
Neutrophils Relative %: 70 %
Platelets: 170 10*3/uL (ref 150–400)
RBC: 5.47 MIL/uL — ABNORMAL HIGH (ref 3.87–5.11)
RDW: 14.2 % (ref 11.5–15.5)
WBC: 4.9 10*3/uL (ref 4.0–10.5)
nRBC: 0 % (ref 0.0–0.2)

## 2020-04-29 LAB — COMPREHENSIVE METABOLIC PANEL
ALT: 143 U/L — ABNORMAL HIGH (ref 0–44)
AST: 162 U/L — ABNORMAL HIGH (ref 15–41)
Albumin: 3.6 g/dL (ref 3.5–5.0)
Alkaline Phosphatase: 51 U/L (ref 38–126)
Anion gap: 14 (ref 5–15)
BUN: 8 mg/dL (ref 6–20)
CO2: 22 mmol/L (ref 22–32)
Calcium: 8.7 mg/dL — ABNORMAL LOW (ref 8.9–10.3)
Chloride: 103 mmol/L (ref 98–111)
Creatinine, Ser: 0.97 mg/dL (ref 0.44–1.00)
GFR calc Af Amer: >60 mL/min (ref >60)
GFR calc non Af Amer: >60 mL/min (ref >60)
Glucose, Bld: 105 mg/dL — ABNORMAL HIGH (ref 70–99)
Potassium: 3.6 mmol/L (ref 3.5–5.1)
Sodium: 139 mmol/L (ref 135–145)
Total Bilirubin: 0.9 mg/dL (ref 0.3–1.2)
Total Protein: 7.2 g/dL (ref 6.5–8.1)

## 2020-04-29 LAB — PROCALCITONIN: Procalcitonin: 0.32 ng/mL

## 2020-04-29 LAB — FERRITIN: Ferritin: 554 ng/mL — ABNORMAL HIGH (ref 11–307)

## 2020-04-29 LAB — I-STAT BETA HCG BLOOD, ED (MC, WL, AP ONLY): I-stat hCG, quantitative: <5 m[IU]/mL (ref <5)

## 2020-04-29 LAB — LACTIC ACID, PLASMA
Lactic Acid, Venous: 1.5 mmol/L (ref 0.5–1.9)
Lactic Acid, Venous: 0.9 mmol/L (ref 0.5–1.9)

## 2020-04-29 LAB — FIBRINOGEN: Fibrinogen: 568 mg/dL — ABNORMAL HIGH (ref 210–475)

## 2020-04-29 LAB — ABO/RH: ABO/RH(D): O POS

## 2020-04-29 LAB — LACTATE DEHYDROGENASE: LDH: 569 U/L — ABNORMAL HIGH (ref 98–192)

## 2020-04-29 LAB — TRIGLYCERIDES: Triglycerides: 53 mg/dL (ref <150)

## 2020-04-29 LAB — C-REACTIVE PROTEIN: CRP: 11.7 mg/dL — ABNORMAL HIGH (ref <1.0)

## 2020-04-29 LAB — D-DIMER, QUANTITATIVE: D-Dimer, Quant: 1.42 ug/mL-FEU — ABNORMAL HIGH (ref 0.00–0.50)

## 2020-04-29 MED ORDER — DEXAMETHASONE 4 MG PO TABS
6.0000 mg | ORAL_TABLET | ORAL | Status: DC
  Administered 2020-04-29: 6 mg via ORAL
  Filled 2020-04-29: qty 2

## 2020-04-29 MED ORDER — PAROXETINE HCL 10 MG PO TABS: 10.0000 mg | ORAL_TABLET | Freq: Every day | ORAL | Status: DC

## 2020-04-29 MED ORDER — SODIUM CHLORIDE 0.9 % IV SOLN
200.0000 mg | Freq: Once | INTRAVENOUS | Status: AC
  Administered 2020-04-29: 200 mg via INTRAVENOUS
  Filled 2020-04-29: qty 40

## 2020-04-29 MED ORDER — POTASSIUM CHLORIDE CRYS ER 20 MEQ PO TBCR
40.0000 meq | EXTENDED_RELEASE_TABLET | Freq: Once | ORAL | Status: AC
  Administered 2020-04-29: 40 meq via ORAL
  Filled 2020-04-29: qty 2

## 2020-04-29 MED ORDER — ONDANSETRON HCL 4 MG PO TABS: 4.0000 mg | ORAL_TABLET | Freq: Four times a day (QID) | ORAL | Status: DC | PRN

## 2020-04-29 MED ORDER — TOCILIZUMAB 400 MG/20ML IV SOLN
800.0000 mg | Freq: Once | INTRAVENOUS | Status: AC
  Administered 2020-04-29: 800 mg via INTRAVENOUS
  Filled 2020-04-29: qty 40

## 2020-04-29 MED ORDER — HYDROCOD POLST-CPM POLST ER 10-8 MG/5ML PO SUER
5.0000 mL | Freq: Two times a day (BID) | ORAL | Status: DC
  Administered 2020-04-29 – 2020-05-05 (×12): 5 mL via ORAL
  Filled 2020-04-29 (×12): qty 5

## 2020-04-29 MED ORDER — ACETAMINOPHEN 650 MG RE SUPP: 650.0000 mg | Freq: Four times a day (QID) | RECTAL | Status: DC | PRN

## 2020-04-29 MED ORDER — GUAIFENESIN-DM 100-10 MG/5ML PO SYRP
10.0000 mL | ORAL_SOLUTION | ORAL | Status: DC | PRN
  Administered 2020-04-30: 10 mL via ORAL
  Filled 2020-04-29 (×2): qty 10

## 2020-04-29 MED ORDER — IPRATROPIUM-ALBUTEROL 20-100 MCG/ACT IN AERS
1.0000 | INHALATION_SPRAY | Freq: Four times a day (QID) | RESPIRATORY_TRACT | Status: DC
  Administered 2020-04-30 – 2020-05-05 (×20): 1 via RESPIRATORY_TRACT
  Filled 2020-04-29 (×2): qty 4

## 2020-04-29 MED ORDER — SODIUM CHLORIDE 0.9 % IV SOLN
100.0000 mg | Freq: Every day | INTRAVENOUS | Status: AC
  Administered 2020-04-30 – 2020-05-03 (×4): 100 mg via INTRAVENOUS
  Filled 2020-04-29 (×4): qty 20

## 2020-04-29 MED ORDER — ENOXAPARIN SODIUM 40 MG/0.4ML ~~LOC~~ SOLN
40.0000 mg | SUBCUTANEOUS | Status: DC
  Administered 2020-04-29: 40 mg via SUBCUTANEOUS
  Filled 2020-04-29: qty 0.4

## 2020-04-29 MED ORDER — SODIUM CHLORIDE 0.9 % IV BOLUS
1000.0000 mL | Freq: Once | INTRAVENOUS | Status: AC
  Administered 2020-04-29: 1000 mL via INTRAVENOUS

## 2020-04-29 MED ORDER — ACETAMINOPHEN 500 MG PO TABS
1000.0000 mg | ORAL_TABLET | Freq: Once | ORAL | Status: AC
  Administered 2020-04-29: 1000 mg via ORAL
  Filled 2020-04-29 (×2): qty 2

## 2020-04-29 MED ORDER — PROMETHAZINE HCL 25 MG PO TABS: 25.0000 mg | ORAL_TABLET | Freq: Four times a day (QID) | ORAL | Status: DC | PRN

## 2020-04-29 MED ORDER — ACETAMINOPHEN 325 MG PO TABS
650.0000 mg | ORAL_TABLET | Freq: Four times a day (QID) | ORAL | Status: DC | PRN
  Administered 2020-05-02: 650 mg via ORAL
  Filled 2020-04-29: qty 2

## 2020-04-29 MED ORDER — DEXAMETHASONE SODIUM PHOSPHATE 10 MG/ML IJ SOLN
6.0000 mg | INTRAMUSCULAR | Status: DC
  Administered 2020-04-30: 6 mg via INTRAVENOUS
  Filled 2020-04-29: qty 1

## 2020-04-29 MED ORDER — ALBUTEROL SULFATE HFA 108 (90 BASE) MCG/ACT IN AERS
1.0000 | INHALATION_SPRAY | RESPIRATORY_TRACT | Status: DC | PRN
  Administered 2020-04-30: 1 via RESPIRATORY_TRACT
  Filled 2020-04-29: qty 6.7

## 2020-04-29 MED ORDER — ADULT MULTIVITAMIN W/MINERALS CH
1.0000 | ORAL_TABLET | Freq: Every day | ORAL | Status: DC
  Administered 2020-04-29 – 2020-05-05 (×7): 1 via ORAL
  Filled 2020-04-29 (×7): qty 1

## 2020-04-29 MED ORDER — SODIUM CHLORIDE 0.9% FLUSH: 3.0000 mL | INTRAVENOUS | Status: DC | PRN

## 2020-04-29 MED ORDER — ONDANSETRON HCL 4 MG/2ML IJ SOLN: 4.0000 mg | Freq: Four times a day (QID) | INTRAMUSCULAR | Status: DC | PRN

## 2020-04-29 MED ORDER — SODIUM CHLORIDE 0.9 % IV SOLN: 250.0000 mL | INTRAVENOUS | Status: DC | PRN

## 2020-04-29 MED ORDER — SODIUM CHLORIDE 0.9% FLUSH
3.0000 mL | Freq: Two times a day (BID) | INTRAVENOUS | Status: DC
  Administered 2020-04-29: 3 mL via INTRAVENOUS

## 2020-04-29 NOTE — H&P (Addendum)
History and Physical    Murry Khiev VOZ:[id number] DOB: 27-May-1996 DOA: 04/29/2020  PCP: Patient, No Pcp Per   Patient coming from: Home   Chief Complaint: dyspnea   HPI: Omni Dunsworth is a 24 y.o. female with medical history significant of obesity who presents with dyspnea.  Patient reports 7 days of not feeling well, consistent with malaise, generalized weakness, nausea, vomiting, diarrhea and poor appetite.  Two days after symptom onset she was diagnosed with SARS COVID-19.  Over the following 5 days she continued to have persistent symptoms, but now associated with fevers and chills.  Over last 3 days she has developed dyspnea which has been persistent, moderate in intensity, worse with exertion, no improving factors, associated with dry cough.  Patient is a truck driver, no known sick contacts.  ED Course: Patient was found tachypneic and tachycardic, resting oximetry 94%.  On ambulation her pulse extremity went down to 90%, and her blood pressure dropped from 120 down to 80 mmHg.  Patient received 2 L of isotonic saline and was referred for admission and further evaluation.  Review of Systems:  1. General: positive fevers but no chills, no weight gain or weight loss 2. ENT: No runny nose or sore throat, no hearing disturbances 3. Pulmonary: positive dyspnea and cough, as mentioned in HPI, with no wheezing, or hemoptysis 4. Cardiovascular: No angina, claudication, lower extremity edema, pnd or orthopnea 5. Gastrointestinal: positive nausea, vomiting, and diarrhea as mentioned in HPI. 6. Hematology: No easy bruisability or frequent infections 7. Urology: No dysuria, hematuria or increased urinary frequency 8. Dermatology: No rashes. 9. Neurology: No seizures or paresthesias 10. Musculoskeletal: No joint pain or deformities  Past Medical History:  Diagnosis Date  . COVID-19   . Menorrhagia 02/27/2014  . Migraines    with aura  . Other and unspecified ovarian cyst 03/07/2014   . Pelvic pain in female 01/29/2015  . Physiological ovarian cysts   . RLQ abdominal pain 02/27/2014  . Unspecified symptom associated with female genital organs 08/26/2014  . Weight gain 01/29/2015    History reviewed. No pertinent surgical history.   reports that she has been smoking cigarettes. She has never used smokeless tobacco. She reports current alcohol use. She reports that she does not use drugs.  No Known Allergies  Family History  Problem Relation Age of Onset  . Cancer Father        bladder  . Kidney failure Father   . Kidney cancer Father   . Cancer Maternal Grandfather        brain tumor  . Alzheimer's disease Paternal Grandfather   . Congestive Heart Failure Maternal Grandmother   . Congestive Heart Failure Paternal Grandmother   . Cancer Maternal Aunt        breast  . Breast cancer Maternal Aunt   . Breast cancer Maternal Aunt      Prior to Admission medications   Medication Sig Start Date End Date Taking? Authorizing Provider  megestrol (MEGACE) 40 MG tablet TAKE 1 TABLET BY MOUTH TWICE A DAY 09/20/19   Cyril Mourning A, NP  naproxen (NAPROSYN) 500 MG tablet Take 1 tablet (500 mg total) by mouth as needed. Take 1 every every 8 hour prn 10/10/18   Cyril Mourning A, NP  PARoxetine (PAXIL) 10 MG tablet Take 1 tablet (10 mg total) by mouth daily. 04/24/19   Adline Potter, NP  promethazine (PHENERGAN) 25 MG tablet Take 1 tablet (25 mg total) by mouth every 6 (six) hours  as needed for nausea or vomiting. 10/22/18   Lazaro Arms, MD    Physical Exam: Vitals:   04/29/20 1511 04/29/20 1615 04/29/20 1802  BP: 119/68 118/66   Pulse: (!) 113 (!) 102 (!) 102  Resp: 18 (!) 31 19  Temp: 100.1 F (37.8 C)    TempSrc: Oral    SpO2: 94% 97% 94%  Weight: 108.9 kg    Height: 5\' 9"  (1.753 m)      Vitals:   04/29/20 1511 04/29/20 1615 04/29/20 1802  BP: 119/68 118/66   Pulse: (!) 113 (!) 102 (!) 102  Resp: 18 (!) 31 19  Temp: 100.1 F (37.8 C)    TempSrc:  Oral    SpO2: 94% 97% 94%  Weight: 108.9 kg    Height: 5\' 9"  (1.753 m)     General: positive dyspnea and increased work of breathing, positive evidence of fatigue.  Neurology: Awake and alert, non focal Head and Neck. Head normocephalic. Neck supple with no adenopathy or thyromegaly.   E ENT: mild pallor, no icterus, oral mucosa moist Cardiovascular: No JVD. S1-S2 present, rhythmic, no gallops, rubs, or murmurs. No lower extremity edema. Pulmonary: positive breath sounds bilaterally, decreased air movement, no wheezing, or rhonchi. Gastrointestinal. Abdomen soft with, no organomegaly, non tender, no rebound or guarding Skin. No rashes Musculoskeletal: no joint deformities    Labs on Admission: I have personally reviewed following labs and imaging studies  CBC: Recent Labs  Lab 04/29/20 1644  WBC 4.9  NEUTROABS 3.5  HGB 15.4*  HCT 48.8*  MCV 89.2  PLT 170   Basic Metabolic Panel: Recent Labs  Lab 04/29/20 1644  NA 139  K 3.6  CL 103  CO2 22  GLUCOSE 105*  BUN 8  CREATININE 0.97  CALCIUM 8.7*   GFR: Estimated Creatinine Clearance: 116.6 mL/min (by C-G formula based on SCr of 0.97 mg/dL). Liver Function Tests: Recent Labs  Lab 04/29/20 1644  AST 162*  ALT 143*  ALKPHOS 51  BILITOT 0.9  PROT 7.2  ALBUMIN 3.6   No results for input(s): LIPASE, AMYLASE in the last 168 hours. No results for input(s): AMMONIA in the last 168 hours. Coagulation Profile: No results for input(s): INR, PROTIME in the last 168 hours. Cardiac Enzymes: No results for input(s): CKTOTAL, CKMB, CKMBINDEX, TROPONINI in the last 168 hours. BNP (last 3 results) No results for input(s): PROBNP in the last 8760 hours. HbA1C: No results for input(s): HGBA1C in the last 72 hours. CBG: No results for input(s): GLUCAP in the last 168 hours. Lipid Profile: Recent Labs    04/29/20 1644  TRIG 53   Thyroid Function Tests: No results for input(s): TSH, T4TOTAL, FREET4, T3FREE, THYROIDAB in  the last 72 hours. Anemia Panel: Recent Labs    04/29/20 1644  FERRITIN 554*   Urine analysis:    Component Value Date/Time   COLORURINE YELLOW 09/30/2015 1940   APPEARANCEUR CLOUDY (A) 09/30/2015 1940   LABSPEC 1.014 09/30/2015 1940   PHURINE 5.5 09/30/2015 1940   GLUCOSEU NEGATIVE 09/30/2015 1940   HGBUR NEGATIVE 09/30/2015 1940   BILIRUBINUR NEGATIVE 09/30/2015 1940   BILIRUBINUR neg 02/24/2014 1002   KETONESUR NEGATIVE 09/30/2015 1940   PROTEINUR NEGATIVE 09/30/2015 1940   UROBILINOGEN 0.2 09/30/2015 1940   NITRITE NEGATIVE 09/30/2015 1940   LEUKOCYTESUR NEGATIVE 09/30/2015 1940    Radiological Exams on Admission: DG Chest Port 1 View  Result Date: 04/29/2020 CLINICAL DATA:  COVID-19 positive, shortness of breath, fevers and chills EXAM:  PORTABLE CHEST 1 VIEW COMPARISON:  Radiograph 02/24/2014 FINDINGS: Lung volumes are markedly diminished with basilar atelectasis and superimposed areas of consolidative opacity predominantly along the periphery of the left lung and partially silhouetting the right hemidiaphragm and right heart border. No pneumothorax. Possible small right effusion. No sizable left effusion. Cardiomediastinal contours are partially obscured by opacity. No acute osseous or soft tissue abnormality. Telemetry leads overlie the chest. IMPRESSION: Diminished lung volumes with basilar atelectasis and superimposed areas of consolidative opacity compatible with a multifocal pneumonia in the setting of COVID-19 positivity. Suspect small right effusion as well, could be better determined with upright PA and lateral radiograph. Electronically Signed   By: Kreg Shropshire M.D.   On: 04/29/2020 16:10    EKG: Independently reviewed. NA  Assessment/Plan Principal Problem:   Pneumonia due to COVID-19 virus Active Problems:   Obesity   Hypokalemia   24 year old female with past medical history of obesity who has been diagnosed with SARS COVID-19 about 5 days ago, she has been  symptomatic for 7 days.  On her initial physical examination her oximetry on ambulation is down to 90%, at rest 94%, her blood pressure is 118/66, heart rate 104, respiratory rate 31, her lungs had no wheezing or rales, heart S1-S2 present and tachycardic, abdomen soft, no lower extremity edema. Sodium 139, potassium 3.6, chloride 93, bicarb 22, glucose 105, BUN 8, creatinine 0.97, white count 4.9, hemoglobin 15.4, hematocrit 48.8, platelets 170.  Chest radiograph, hypo-inflated with interstitial infiltrates bilateral at bases and at the left upper lobe.  Patient will be admitted to the hospital with the working diagnosis of acute hypoxic respiratory failure due to SARS COVID-19 viral pneumonia.  1.  Acute hypoxic respiratory failure due to SARS Covid 19 viral pneumonia.   COVID-19 Labs  Recent Labs    04/29/20 1644  DDIMER 1.42*  FERRITIN 554*  LDH 569*  CRP 11.7*    No results found for: SARSCOV2NAA  Patient with multifocal pneumonia and elevated inflammatory markers.  She will be admitted to the medical ward, continue supplemental oxygen per nasal cannula, target oxygen saturation more than 92%.  Will place patient with systemic steroids, dexamethasone 6 mg intravenously every 24 hours, antiviral therapy with remdesivir.  Considering high inflammatory markers and multifocal pneumonia along with increase work of breathing, will proceed with Tocilizumab.  In the setting of  hypoxic respiratory failure, I explained the patient in detail, the use of advanced therapy: Tocilizumab.  I informed her  the risks of this interventions including immunosuppression, opportunistic infections, gastrointestinal complications, allergic reactions, volume overload and others.  I explained her the experimental nature of this interventions, in the face of current COVID 19 pandemic, along with the potential benefits of improvement of hypoxic respiratory failure and systemic inflammation.  She has agreed to  proceed with tocilizumab infusion.  She will placed on bronchodilator therapy, antitussive agents, and airway clearing techniques with incentive spirometer and flutter valve.  Continue prophylactic anticoagulation with enoxaparin.  2.  Acute dehydration.  Patient received 2 L of isotonic saline in the emergency department, for now will hold on further IV fluids to avoid volume overload, will follow restrictive IV fluid strategy.  3.  Acute hypokalemia.  Will replete potassium with potassium chloride, follow-up kidney function in the morning.  4.  Obesity class 2.  Calculated BMI 35.4, will need outpatient follow-up.  5. Depression. Continue paroxetine.    Status is: Inpatient  Remains inpatient appropriate because:IV treatments appropriate due to intensity of illness or  inability to take PO   Dispo: The patient is from: Home              Anticipated d/c is to: Home              Anticipated d/c date is: > 3 days              Patient currently is not medically stable to d/c.    DVT prophylaxis:  enoxaparin  Code Status:   full  Family Communication:  No family at the bedside     Consults called:  None   Admission status:  Inpatient.    Mauricio Gerome Apley MD [facility]   04/29/2020, 6:21 PM

## 2020-04-29 NOTE — ED Notes (Signed)
w/o O2,. Pt O2 @ 88 while ambulating.

## 2020-04-29 NOTE — ED Provider Notes (Signed)
[facility] EMERGENCY DEPARTMENT Provider Note   CSN: [id number] Arrival date & time: 04/29/20  1414     History Chief Complaint  Patient presents with  . COVID +  . Emesis  . Diarrhea  . Fever    Diane Shelton is a 24 y.o. female with a past medical history of obesity, who presents today for evaluation of shortness of breath.  She is on day 6 of Covid symptoms, she was tested in outside facility.  She reports that she has had very poor p.o. intake over the past 3 to 4 days due to nausea and vomiting.  She reports she has vomited twice today however notes that she has not eaten anything and is still continuing to vomit "yellow bile."  She also reports multiple episodes of diarrhea.  She reports fevers at home, last Tylenol was at 9 AM this morning.  She states that she has not been vaccinated against Covid.  She denies asthma or any other lung issues.  She states that today her breathing got worse and she feels like it is hard to both get air in and out.  No leg swelling.  No hemoptysis.  She has a cough that is nonproductive.  HPI     Past Medical History:  Diagnosis Date  . COVID-19   . Menorrhagia 02/27/2014  . Migraines    with aura  . Other and unspecified ovarian cyst 03/07/2014  . Pelvic pain in female 01/29/2015  . Physiological ovarian cysts   . RLQ abdominal pain 02/27/2014  . Unspecified symptom associated with female genital organs 08/26/2014  . Weight gain 01/29/2015    Patient Active Problem List   Diagnosis Date Noted  . Pneumonia due to COVID-19 virus 04/29/2020  . Obesity 04/29/2020  . Hypokalemia 04/29/2020  . Dysmenorrhea 10/18/2017  . Pelvic pain 01/29/2015  . Weight gain 01/29/2015  . Unspecified symptom associated with female genital organs 08/26/2014  . Other and unspecified ovarian cyst 03/07/2014  . Menorrhagia 02/27/2014    History reviewed. No pertinent surgical history.   OB History    Gravida  0   Para  0   Term    0   Preterm  0   AB  0   Living  0     SAB  0   TAB  0   Ectopic  0   Multiple  0   Live Births              Family History  Problem Relation Age of Onset  . Cancer Father        bladder  . Kidney failure Father   . Kidney cancer Father   . Cancer Maternal Grandfather        brain tumor  . Alzheimer's disease Paternal Grandfather   . Congestive Heart Failure Maternal Grandmother   . Congestive Heart Failure Paternal Grandmother   . Cancer Maternal Aunt        breast  . Breast cancer Maternal Aunt   . Breast cancer Maternal Aunt     Social History   Tobacco Use  . Smoking status: Current Some Day Smoker    Types: Cigarettes  . Smokeless tobacco: Never Used  . Tobacco comment: smokes when drinks  Substance Use Topics  . Alcohol use: Yes    Comment: occac  . Drug use: No    Home Medications Prior to Admission medications   Medication Sig Start Date End Date Taking? Authorizing Provider  acetaminophen (TYLENOL) 500 MG tablet Take 500-1,000 mg by mouth every 6 (six) hours as needed (for fevers or body aches).   Yes [provider]  citalopram (CELEXA) 20 MG tablet Take 20 mg by mouth daily. 02/24/20  Yes [provider]  megestrol (MEGACE) 40 MG tablet TAKE 1 TABLET BY MOUTH TWICE A DAY Patient taking differently: Take 40 mg by mouth 2 (two) times daily.  09/20/19  Yes Cyril Mourning A, NP  naproxen (NAPROSYN) 500 MG tablet Take 1 tablet (500 mg total) by mouth as needed. Take 1 every every 8 hour prn Patient not taking: Reported on 04/29/2020 10/10/18   Adline Potter, NP  promethazine (PHENERGAN) 25 MG tablet Take 1 tablet (25 mg total) by mouth every 6 (six) hours as needed for nausea or vomiting. Patient not taking: Reported on 04/29/2020 10/22/18   Lazaro Arms, MD    Allergies    Patient has no known allergies.  Review of Systems   Review of Systems  Constitutional: Positive for appetite change, chills, diaphoresis,  fatigue and fever.  HENT: Negative for congestion.   Eyes: Negative for visual disturbance.  Respiratory: Positive for cough, chest tightness and shortness of breath. Negative for wheezing.   Cardiovascular: Negative for chest pain, palpitations and leg swelling.  Gastrointestinal: Positive for diarrhea, nausea and vomiting. Negative for abdominal pain.  Genitourinary: Negative for dysuria.  Musculoskeletal: Positive for arthralgias and myalgias.  Skin: Negative for color change and wound.  Neurological: Positive for headaches. Negative for weakness.  All other systems reviewed and are negative.   Physical Exam Updated Vital Signs BP 102/70   Pulse 95   Temp 100.1 F (37.8 C) (Oral)   Resp (!) 24   Ht 5\' 9"  (1.753 m)   Wt 108.9 kg   SpO2 95%   BMI 35.44 kg/m   Physical Exam Vitals and nursing note reviewed.  Constitutional:      General: She is not in acute distress.    Appearance: She is well-developed. She is obese. She is not diaphoretic.  HENT:     Head: Normocephalic and atraumatic.  Eyes:     General: No scleral icterus.       Right eye: No discharge.        Left eye: No discharge.     Conjunctiva/sclera: Conjunctivae normal.  Cardiovascular:     Rate and Rhythm: Normal rate and regular rhythm.     Pulses: Normal pulses.     Heart sounds: Normal heart sounds.  Pulmonary:     Effort: Pulmonary effort is normal. No respiratory distress.     Breath sounds: No stridor.  Abdominal:     General: There is no distension.     Tenderness: There is no abdominal tenderness. There is no guarding.  Musculoskeletal:        General: No deformity.     Cervical back: Normal range of motion and neck supple.     Right lower leg: No edema.     Left lower leg: No edema.  Skin:    General: Skin is warm and dry.  Neurological:     Mental Status: She is alert.     Cranial Nerves: No cranial nerve deficit.     Motor: No abnormal muscle tone.  Psychiatric:        Mood and  Affect: Mood normal.        Behavior: Behavior normal.     ED Results / Procedures / Treatments  Labs (all labs ordered are listed, but only abnormal results are displayed) Labs Reviewed  CBC WITH DIFFERENTIAL/PLATELET - Abnormal; Notable for the following components:      Result Value   RBC 5.47 (*)    Hemoglobin 15.4 (*)    HCT 48.8 (*)    All other components within normal limits  COMPREHENSIVE METABOLIC PANEL - Abnormal; Notable for the following components:   Glucose, Bld 105 (*)    Calcium 8.7 (*)    AST 162 (*)    ALT 143 (*)    All other components within normal limits  D-DIMER, QUANTITATIVE (NOT AT [facility]) - Abnormal; Notable for the following components:   D-Dimer, Quant 1.42 (*)    All other components within normal limits  LACTATE DEHYDROGENASE - Abnormal; Notable for the following components:   LDH 569 (*)    All other components within normal limits  FERRITIN - Abnormal; Notable for the following components:   Ferritin 554 (*)    All other components within normal limits  FIBRINOGEN - Abnormal; Notable for the following components:   Fibrinogen 568 (*)    All other components within normal limits  C-REACTIVE PROTEIN - Abnormal; Notable for the following components:   CRP 11.7 (*)    All other components within normal limits  CULTURE, BLOOD (ROUTINE X 2)  CULTURE, BLOOD (ROUTINE X 2)  LACTIC ACID, PLASMA  TRIGLYCERIDES  PROCALCITONIN  LACTIC ACID, PLASMA  HIV ANTIBODY (ROUTINE TESTING W REFLEX)  CBC  CREATININE, SERUM  COMPREHENSIVE METABOLIC PANEL  C-REACTIVE PROTEIN  D-DIMER, QUANTITATIVE (NOT AT [facility])  FERRITIN  I-STAT BETA HCG BLOOD, ED ([facility], [facility], [facility] ONLY)  ABO/RH      EKG None  Radiology DG Chest Port 1 View  Result Date: 04/29/2020 CLINICAL DATA:  COVID-19 positive, shortness of breath, fevers and chills EXAM: PORTABLE CHEST 1 VIEW COMPARISON:  Radiograph 02/24/2014 FINDINGS: Lung volumes are markedly diminished with basilar atelectasis and  superimposed areas of consolidative opacity predominantly along the periphery of the left lung and partially silhouetting the right hemidiaphragm and right heart border. No pneumothorax. Possible small right effusion. No sizable left effusion. Cardiomediastinal contours are partially obscured by opacity. No acute osseous or soft tissue abnormality. Telemetry leads overlie the chest. IMPRESSION: Diminished lung volumes with basilar atelectasis and superimposed areas of consolidative opacity compatible with a multifocal pneumonia in the setting of COVID-19 positivity. Suspect small right effusion as well, could be better determined with upright PA and lateral radiograph. Electronically Signed   By: Kreg Shropshire M.D.   On: 04/29/2020 16:10    Procedures Procedures (including critical care time)  Medications Ordered in ED Medications  sodium chloride 0.9 % bolus 1,000 mL (0 mLs Intravenous Stopped 04/29/20 1911)  acetaminophen (TYLENOL) tablet 1,000 mg (1,000 mg Oral Given 04/29/20 1714)  potassium chloride SA (KLOR-CON) CR tablet 40 mEq (40 mEq Oral Given 04/29/20 1942)    ED Course  I have reviewed the triage vital signs and the nursing notes.  Pertinent labs & imaging results that were available during my care of the patient were reviewed by me and considered in my medical decision making (see chart for details).  Clinical Course as of Apr 29 2012  Wed Apr 29, 2020  8921 I talked with hospital doctor who will admit patient.    [EH]    Clinical Course User Index [EH] Norman Clay   MDM Rules/Calculators/A&P  Patient is a 24 year old woman on day 6 of Covid symptoms who presents today for evaluation of worsening shortness of breath.  On my evaluation she is tachypneic at about 30.  When I had her attempt to ambulate in the room she had a significant blood pressure drop with her systolic going from about 120 down into the 80s.  In addition to this her oxygen  saturation decreased from mid 90s down to 91% and she became more tachypneic with respiratory rates in the 40s and very symptomatic.  Chest x-ray was obtained and reviewed showing bibasilar atelectasis, multifocal pneumonia consistent with Covid and a possible small right pleural effusion as well.    She was placed on 1 L nasal cannula oxygen with improvement in her tachypnea and oxygen saturation. She does report large amounts of nausea vomiting and diarrhea with generally poor p.o. intake.  Given that she is orthostatic and reports history of volume loss consistent with overall volume depletion IV fluid boluses ordered.  Covid type labs are ordered.  She does have a positive result on her phone which I personally viewed.    Labs reviewed, hemoglobin is elevated at 15.4 which I suspect is related to dehydration.  CMP shows mild transaminitis, consistent with Covid without significant electrolyte derangements.  D-dimer is slightly elevated at 1.42, expected given her being Covid positive.  While in room her temperature was 101.  Tylenol is ordered.  Based on her desaturating to 91% with significant tachypnea with minimal exertion, improvement on oxygen and chest x-ray I am concerned that she may continue to worsen especially as she is only on day 6 of symptoms.    I spoke with Dr. Ella Jubilee who will see the patient for admission.   Note: Portions of this report may have been transcribed using voice recognition software. Every effort was made to ensure accuracy; however, inadvertent computerized transcription errors may be present   Final Clinical Impression(s) / ED Diagnoses Final diagnoses:  COVID-19  Dehydration    Rx / DC Orders ED Discharge Orders    None       Norman Clay 04/29/20 2241    Gerhard Munch, MD 04/30/20 0003

## 2020-04-29 NOTE — ED Triage Notes (Signed)
Pt states she was diagnosed with COVID on Friday.  C/o SOB, fever, chills, nausea, vomiting, diarrhea, and generalized body aches.

## 2020-04-29 NOTE — ED Notes (Signed)
Pt has confirmed covid19 virus through testing at [facility]. Pt has the information on her phone. Provider notified.

## 2020-04-30 LAB — COMPREHENSIVE METABOLIC PANEL
ALT: 134 U/L — ABNORMAL HIGH (ref 0–44)
AST: 149 U/L — ABNORMAL HIGH (ref 15–41)
Albumin: 3.3 g/dL — ABNORMAL LOW (ref 3.5–5.0)
Alkaline Phosphatase: 44 U/L (ref 38–126)
Anion gap: 12 (ref 5–15)
BUN: 8 mg/dL (ref 6–20)
CO2: 19 mmol/L — ABNORMAL LOW (ref 22–32)
Calcium: 8.5 mg/dL — ABNORMAL LOW (ref 8.9–10.3)
Chloride: 110 mmol/L (ref 98–111)
Creatinine, Ser: 0.72 mg/dL (ref 0.44–1.00)
GFR calc Af Amer: >60 mL/min (ref >60)
GFR calc non Af Amer: >60 mL/min (ref >60)
Glucose, Bld: 120 mg/dL — ABNORMAL HIGH (ref 70–99)
Potassium: 4.7 mmol/L (ref 3.5–5.1)
Sodium: 141 mmol/L (ref 135–145)
Total Bilirubin: 0.6 mg/dL (ref 0.3–1.2)
Total Protein: 6.4 g/dL — ABNORMAL LOW (ref 6.5–8.1)

## 2020-04-30 LAB — CBC
HCT: 45.5 % (ref 36.0–46.0)
Hemoglobin: 14.4 g/dL (ref 12.0–15.0)
MCH: 28.8 pg (ref 26.0–34.0)
MCHC: 31.6 g/dL (ref 30.0–36.0)
MCV: 91 fL (ref 80.0–100.0)
Platelets: 175 10*3/uL (ref 150–400)
RBC: 5 MIL/uL (ref 3.87–5.11)
RDW: 14.4 % (ref 11.5–15.5)
WBC: 3.3 10*3/uL — ABNORMAL LOW (ref 4.0–10.5)
nRBC: 0 % (ref 0.0–0.2)

## 2020-04-30 LAB — C-REACTIVE PROTEIN: CRP: 9.6 mg/dL — ABNORMAL HIGH (ref <1.0)

## 2020-04-30 LAB — D-DIMER, QUANTITATIVE: D-Dimer, Quant: 1.06 ug/mL-FEU — ABNORMAL HIGH (ref 0.00–0.50)

## 2020-04-30 LAB — BRAIN NATRIURETIC PEPTIDE: B Natriuretic Peptide: 41.7 pg/mL (ref 0.0–100.0)

## 2020-04-30 LAB — PROCALCITONIN: Procalcitonin: 0.23 ng/mL

## 2020-04-30 LAB — HIV ANTIBODY (ROUTINE TESTING W REFLEX): HIV Screen 4th Generation wRfx: NONREACTIVE

## 2020-04-30 LAB — MAGNESIUM: Magnesium: 2.4 mg/dL (ref 1.7–2.4)

## 2020-04-30 LAB — FERRITIN: Ferritin: 488 ng/mL — ABNORMAL HIGH (ref 11–307)

## 2020-04-30 MED ORDER — CITALOPRAM HYDROBROMIDE 20 MG PO TABS
20.0000 mg | ORAL_TABLET | Freq: Every day | ORAL | Status: DC
  Administered 2020-04-30 – 2020-05-05 (×6): 20 mg via ORAL
  Filled 2020-04-30 (×6): qty 1

## 2020-04-30 MED ORDER — LACTATED RINGERS IV SOLN: INTRAVENOUS | Status: DC

## 2020-04-30 MED ORDER — ENOXAPARIN SODIUM 60 MG/0.6ML ~~LOC~~ SOLN: 55.0000 mg | Freq: Every day | SUBCUTANEOUS | Status: DC

## 2020-04-30 MED ORDER — ZINC SULFATE 220 (50 ZN) MG PO CAPS
220.0000 mg | ORAL_CAPSULE | Freq: Every day | ORAL | Status: DC
  Administered 2020-04-30 – 2020-05-05 (×6): 220 mg via ORAL
  Filled 2020-04-30 (×6): qty 1

## 2020-04-30 MED ORDER — ASCORBIC ACID 500 MG PO TABS
500.0000 mg | ORAL_TABLET | Freq: Every day | ORAL | Status: DC
  Administered 2020-04-30 – 2020-05-05 (×6): 500 mg via ORAL
  Filled 2020-04-30 (×6): qty 1

## 2020-04-30 MED ORDER — ENOXAPARIN SODIUM 60 MG/0.6ML ~~LOC~~ SOLN
55.0000 mg | SUBCUTANEOUS | Status: DC
  Administered 2020-04-30 – 2020-05-04 (×5): 55 mg via SUBCUTANEOUS
  Filled 2020-04-30 (×5): qty 0.6

## 2020-04-30 MED ORDER — ALBUTEROL SULFATE HFA 108 (90 BASE) MCG/ACT IN AERS
2.0000 | INHALATION_SPRAY | RESPIRATORY_TRACT | Status: DC | PRN
  Administered 2020-04-30 – 2020-05-01 (×3): 2 via RESPIRATORY_TRACT
  Filled 2020-04-30: qty 6.7

## 2020-04-30 NOTE — Progress Notes (Addendum)
PROGRESS NOTE                                                                                                                                                                                                             Patient Demographics:    Diane Shelton, is a 24 y.o. female, DOB - 1996/03/25, BOF:[id number]  Outpatient Primary MD for the patient is Patient, No Pcp Per    LOS - 1  Admit date - 04/29/2020    Chief Complaint  Patient presents with  . Covid Exposure  . Emesis  . Diarrhea  . Fever       Brief Narrative - Diane Shelton is a 24 y.o. female with medical history significant of obesity who presents with dyspnea.  Patient reports 7 days of not feeling well, consistent with malaise, generalized weakness, nausea, vomiting, diarrhea and poor appetite, she presented to [facility] ER where she was diagnosed with acute hypoxic respiratory failure due to COVID-19 pneumonia and admitted to the hospital.  At [facility] she received IV steroids, her first dose of remdesivir along with Actemra.   Subjective:    Diane Shelton today has, No headache, No chest pain, No abdominal pain - No Nausea, No new weakness tingling or numbness, mild dry cough and exertional shortness of breath.       Assessment  & Plan :     1. Acute Hypoxic Resp. Failure due to Acute Covid 19 Viral Pneumonitis during the ongoing 2020 Covid 19 Pandemic - she has severe disease she was appropriately treated at [facility] with IV steroids, remdesivir and Actemra, continue to monitor closely.  Continue remdesivir and steroids.  Note she has consented to Actemra use if needed, all risks and benefits were discussed.  Encouraged the patient to sit up in chair in the daytime use I-S and flutter valve for pulmonary toiletry and then prone in bed when at night.  Will advance activity and titrate down oxygen as possible.  Actemra off label use -  patient was told that if COVID-19 pneumonitis gets worse we might potentially use Actemra off label, she denies any known history of active diverticulitis, tuberculosis or hepatitis, no active Diverticulitis or known other active infections, understands the risks and benefits and wants to proceed with Actemra treatment if required.   SpO2: 92 % O2  Flow Rate (L/min): 2 L/min  Recent Labs  Lab 04/29/20 1644 04/30/20 0545  CRP 11.7* 9.6*  DDIMER 1.42* 1.06*  BNP  --  41.7  PROCALCITON 0.32 0.23    Hepatic Function Latest Ref Rng & Units 04/30/2020 04/29/2020 12/10/2008  Total Protein 6.5 - 8.1 g/dL 6.4(L) 7.2 7.3  Albumin 3.5 - 5.0 g/dL 3.3(L) 3.6 4.6  AST 15 - 41 U/L 149(H) 162(H) 27  ALT 0 - 44 U/L 134(H) 143(H) 12  Alk Phosphatase 38 - 126 U/L 44 51 73  Total Bilirubin 0.3 - 1.2 mg/dL 0.6 0.9 1.0     2.  COVID-19 viral illness induced mild asymptomatic transaminitis.  Trend  3.  Viral illness induced gastroenteritis.  Supportive care.   4.  Dehydration.  IV fluids on 04/30/2020.     Condition - Extremely Guarded  Family Communication  :  None  Code Status :  Full  Consults  :  None  Disposition Plan  :    Status is: Inpatient  Remains inpatient appropriate because:IV treatments appropriate due to intensity of illness or inability to take PO   Dispo: The patient is from: Home              Anticipated d/c is to: Home              Anticipated d/c date is: > 3 days              Patient currently is medically stable to d/c.  Procedures  :  None  PUD Prophylaxis : None  DVT Prophylaxis  :  Lovenox   Lab Results  Component Value Date   PLT 175 04/30/2020    Diet :  Diet Order            Diet regular Room service appropriate? Yes; Fluid consistency: Thin  Diet effective now               Inpatient Medications  Scheduled Meds: . vitamin C  500 mg Oral Daily  . chlorpheniramine-HYDROcodone  5 mL Oral Q12H  . citalopram  20 mg Oral Daily  .  dexamethasone (DECADRON) injection  6 mg Intravenous Q24H  . Ipratropium-Albuterol  1 puff Inhalation Q6H  . multivitamin with minerals  1 tablet Oral Daily  . zinc sulfate  220 mg Oral Daily   Continuous Infusions: . lactated ringers    . remdesivir 100 mg in NS 100 mL 100 mg (04/30/20 0759)   PRN Meds:.acetaminophen **OR** acetaminophen, albuterol, guaiFENesin-dextromethorphan, [DISCONTINUED] ondansetron **OR** ondansetron (ZOFRAN) IV  Antibiotics  :    Anti-infectives (From admission, onward)   Start     Dose/Rate Route Frequency Ordered Stop   04/30/20 1000  remdesivir 100 mg in sodium chloride 0.9 % 100 mL IVPB     100 mg 200 mL/hr over 30 Minutes Intravenous Daily 04/29/20 1911 05/04/20 0959   04/29/20 1915  remdesivir 200 mg in sodium chloride 0.9% 250 mL IVPB     200 mg 580 mL/hr over 30 Minutes Intravenous Once 04/29/20 1911 04/30/20 0424       Time Spent in minutes  30   Lala Lund M.D on 04/30/2020 at 10:56 AM  To page go to [email or web address] - password [facility]  [facility] -  Office  [phone]   See all Orders from today for further details    Objective:   Vitals:   04/29/20 2223 04/30/20 0035 04/30/20 0407 04/30/20 0750  BP:  (!) 108/53  101/68  Pulse:    76  Resp:  20  (!) 22  Temp:  98.8 F (37.1 C) 98 F (36.7 C) 98 F (36.7 C)  TempSrc:  Oral Oral Oral  SpO2:    92%  Weight:      Height: 5' 9"  (1.753 m)       Wt Readings from Last 3 Encounters:  04/29/20 114 kg  10/22/18 98.4 kg  09/07/18 97.8 kg     Intake/Output Summary (Last 24 hours) at 04/30/2020 1056 Last data filed at 04/30/2020 0850 Gross per 24 hour  Intake 776.31 ml  Output 500 ml  Net 276.31 ml     Physical Exam  Awake Alert, No new F.N deficits, Normal affect [place].AT,PERRAL Supple Neck,No JVD, No cervical lymphadenopathy appriciated.  Symmetrical Chest wall movement, Good air movement bilaterally, CTAB RRR,No Gallops,Rubs or new Murmurs, No Parasternal  Heave +ve B.Sounds, Abd Soft, No tenderness, No organomegaly appriciated, No rebound - guarding or rigidity. No Cyanosis, Clubbing or edema, No new Rash or bruise      Data Review:    CBC Recent Labs  Lab 04/29/20 1644 04/30/20 0545  WBC 4.9 3.3*  HGB 15.4* 14.4  HCT 48.8* 45.5  PLT 170 175  MCV 89.2 91.0  MCH 28.2 28.8  MCHC 31.6 31.6  RDW 14.2 14.4  LYMPHSABS 1.1  --   MONOABS 0.3  --   EOSABS 0.0  --   BASOSABS 0.0  --     Chemistries  Recent Labs  Lab 04/29/20 1644 04/30/20 0545  NA 139 141  K 3.6 4.7  CL 103 110  CO2 22 19*  GLUCOSE 105* 120*  BUN 8 8  CREATININE 0.97 0.72  CALCIUM 8.7* 8.5*  AST 162* 149*  ALT 143* 134*  ALKPHOS 51 44  BILITOT 0.9 0.6  MG  --  2.4     ------------------------------------------------------------------------------------------------------------------ Recent Labs    04/29/20 1644  TRIG 53    No results found for: HGBA1C ------------------------------------------------------------------------------------------------------------------ No results for input(s): TSH, T4TOTAL, T3FREE, THYROIDAB in the last 72 hours.  Invalid input(s): FREET3  Cardiac Enzymes No results for input(s): CKMB, TROPONINI, MYOGLOBIN in the last 168 hours.  Invalid input(s): CK ------------------------------------------------------------------------------------------------------------------    Component Value Date/Time   BNP 41.7 04/30/2020 0545    Micro Results Recent Results (from the past 240 hour(s))  Blood Culture (routine x 2)     Status: None (Preliminary result)   Collection Time: 04/29/20  4:25 PM   Specimen: BLOOD RIGHT HAND  Result Value Ref Range Status   Specimen Description BLOOD RIGHT HAND  Final   Special Requests   Final    BOTTLES DRAWN AEROBIC AND ANAEROBIC Blood Culture results may not be optimal due to an inadequate volume of blood received in culture bottles   Culture   Final    NO GROWTH < 24 HOURS Performed  at [facility], [place] [address], [place], [place] [address]    Report Status PENDING  Incomplete  Blood Culture (routine x 2)     Status: None (Preliminary result)   Collection Time: 04/29/20  5:09 PM   Specimen: BLOOD  Result Value Ref Range Status   Specimen Description BLOOD RIGHT ANTECUBITAL  Final   Special Requests   Final    BOTTLES DRAWN AEROBIC AND ANAEROBIC Blood Culture adequate volume   Culture   Final    NO GROWTH < 24 HOURS Performed at [facility], [place],  [place] [address]    Report Status PENDING  Incomplete    Radiology Reports DG Chest Port 1 View  Result Date: 04/29/2020 CLINICAL DATA:  COVID-19 positive, shortness of breath, fevers and chills EXAM: PORTABLE CHEST 1 VIEW COMPARISON:  Radiograph 02/24/2014 FINDINGS: Lung volumes are markedly diminished with basilar atelectasis and superimposed areas of consolidative opacity predominantly along the periphery of the left lung and partially silhouetting the right hemidiaphragm and right heart border. No pneumothorax. Possible small right effusion. No sizable left effusion. Cardiomediastinal contours are partially obscured by opacity. No acute osseous or soft tissue abnormality. Telemetry leads overlie the chest. IMPRESSION: Diminished lung volumes with basilar atelectasis and superimposed areas of consolidative opacity compatible with a multifocal pneumonia in the setting of COVID-19 positivity. Suspect small right effusion as well, could be better determined with upright PA and lateral radiograph. Electronically Signed   By: Lovena Le M.D.   On: 04/29/2020 16:10

## 2020-05-01 ENCOUNTER — Inpatient Hospital Stay (HOSPITAL_COMMUNITY): Payer: BC Managed Care – PPO

## 2020-05-01 LAB — COMPREHENSIVE METABOLIC PANEL
ALT: 126 U/L — ABNORMAL HIGH (ref 0–44)
AST: 112 U/L — ABNORMAL HIGH (ref 15–41)
Albumin: 3 g/dL — ABNORMAL LOW (ref 3.5–5.0)
Alkaline Phosphatase: 45 U/L (ref 38–126)
Anion gap: 10 (ref 5–15)
BUN: 11 mg/dL (ref 6–20)
CO2: 21 mmol/L — ABNORMAL LOW (ref 22–32)
Calcium: 8.6 mg/dL — ABNORMAL LOW (ref 8.9–10.3)
Chloride: 110 mmol/L (ref 98–111)
Creatinine, Ser: 0.72 mg/dL (ref 0.44–1.00)
GFR calc Af Amer: >60 mL/min (ref >60)
GFR calc non Af Amer: >60 mL/min (ref >60)
Glucose, Bld: 152 mg/dL — ABNORMAL HIGH (ref 70–99)
Potassium: 4.6 mmol/L (ref 3.5–5.1)
Sodium: 141 mmol/L (ref 135–145)
Total Bilirubin: 0.7 mg/dL (ref 0.3–1.2)
Total Protein: 6.1 g/dL — ABNORMAL LOW (ref 6.5–8.1)

## 2020-05-01 LAB — CBC WITH DIFFERENTIAL/PLATELET
Abs Immature Granulocytes: 0.14 10*3/uL — ABNORMAL HIGH (ref 0.00–0.07)
Basophils Absolute: 0 10*3/uL (ref 0.0–0.1)
Basophils Relative: 1 %
Eosinophils Absolute: 0 10*3/uL (ref 0.0–0.5)
Eosinophils Relative: 0 %
HCT: 44.3 % (ref 36.0–46.0)
Hemoglobin: 13.9 g/dL (ref 12.0–15.0)
Immature Granulocytes: 3 %
Lymphocytes Relative: 36 %
Lymphs Abs: 1.7 10*3/uL (ref 0.7–4.0)
MCH: 28.4 pg (ref 26.0–34.0)
MCHC: 31.4 g/dL (ref 30.0–36.0)
MCV: 90.4 fL (ref 80.0–100.0)
Monocytes Absolute: 0.4 10*3/uL (ref 0.1–1.0)
Monocytes Relative: 8 %
Neutro Abs: 2.5 10*3/uL (ref 1.7–7.7)
Neutrophils Relative %: 52 %
Platelets: 253 10*3/uL (ref 150–400)
RBC: 4.9 MIL/uL (ref 3.87–5.11)
RDW: 14.4 % (ref 11.5–15.5)
WBC: 4.7 10*3/uL (ref 4.0–10.5)
nRBC: 0 % (ref 0.0–0.2)

## 2020-05-01 LAB — C-REACTIVE PROTEIN: CRP: 4.4 mg/dL — ABNORMAL HIGH (ref <1.0)

## 2020-05-01 LAB — MAGNESIUM: Magnesium: 2.2 mg/dL (ref 1.7–2.4)

## 2020-05-01 LAB — PROCALCITONIN: Procalcitonin: <0.1 ng/mL

## 2020-05-01 LAB — D-DIMER, QUANTITATIVE: D-Dimer, Quant: 0.73 ug/mL-FEU — ABNORMAL HIGH (ref 0.00–0.50)

## 2020-05-01 LAB — BRAIN NATRIURETIC PEPTIDE: B Natriuretic Peptide: 46.5 pg/mL (ref 0.0–100.0)

## 2020-05-01 MED ORDER — TOCILIZUMAB 400 MG/20ML IV SOLN
800.0000 mg | INTRAVENOUS | Status: AC
  Administered 2020-05-01: 800 mg via INTRAVENOUS
  Filled 2020-05-01: qty 40

## 2020-05-01 MED ORDER — FUROSEMIDE 10 MG/ML IJ SOLN
20.0000 mg | Freq: Once | INTRAMUSCULAR | Status: AC
  Administered 2020-05-01: 20 mg via INTRAVENOUS
  Filled 2020-05-01: qty 2

## 2020-05-01 MED ORDER — METHYLPREDNISOLONE SODIUM SUCC 125 MG IJ SOLR
60.0000 mg | Freq: Two times a day (BID) | INTRAMUSCULAR | Status: DC
  Administered 2020-05-01 (×2): 60 mg via INTRAVENOUS
  Filled 2020-05-01 (×2): qty 2

## 2020-05-01 MED ORDER — IOHEXOL 350 MG/ML SOLN
80.0000 mL | Freq: Once | INTRAVENOUS | Status: AC | PRN
  Administered 2020-05-01: 80 mL via INTRAVENOUS

## 2020-05-01 NOTE — Progress Notes (Signed)
PROGRESS NOTE                                                                                                                                                                                                             Patient Demographics:    Diane Shelton, is a 25 y.o. female, DOB - June 20, 1996, ZHY:[id number]  Outpatient Primary MD for the patient is Patient, No Pcp Per    LOS - 2  Admit date - 04/29/2020    Chief Complaint  Patient presents with  . Covid Exposure  . Emesis  . Diarrhea  . Fever       Brief Narrative - Diane Shelton is a 24 y.o. female with medical history significant of obesity who presents with dyspnea.  Patient reports 7 days of not feeling well, consistent with malaise, generalized weakness, nausea, vomiting, diarrhea and poor appetite, she presented to [facility], ER where she was diagnosed with acute hypoxic respiratory failure due to COVID-19 pneumonia and admitted to the hospital.  At [facility] she received IV steroids, her first dose of remdesivir along with Actemra.   Subjective:   Patient in bed, appears comfortable, denies any headache, no fever, no chest pain or pressure, ++ shortness of breath , no abdominal pain. No focal weakness.   Assessment  & Plan :     1. Acute Hypoxic Resp. Failure due to Acute Covid 19 Viral Pneumonitis during the ongoing 2020 Covid 19 Pandemic - she has severe disease she was appropriately treated at [facility] with IV steroids, remdesivir and Actemra, unfortunately on 05/01/2020 she continues to be extremely short of breath even upon minimal ambulation, temporarily had to be placed on 2 L oxygen by me on 05/01/2020 I suspect due to her obesity her first dose of Actemra was underdosed hence I will repeated.  Have also increase her steroid dose.  Since D-dimer was borderline will go ahead and get CTA as well.  Note she already consented for Actemra twice  before.  Encouraged the patient to sit up in chair in the daytime use I-S and flutter valve for pulmonary toiletry and then prone in bed when at night.  Will advance activity and titrate down oxygen as possible.  Actemra off label use - patient was told that if COVID-19 pneumonitis gets worse we might potentially use Actemra  off label, she denies any known history of active diverticulitis, tuberculosis or hepatitis, no active Diverticulitis or known other active infections, understands the risks and benefits and wants to proceed with Actemra treatment if required.   SpO2: 95 % O2 Flow Rate (L/min): 4 L/min  Recent Labs  Lab 04/29/20 1644 04/30/20 0545 05/01/20 0349  CRP 11.7* 9.6* 4.4*  DDIMER 1.42* 1.06* 0.73*  BNP  --  41.7 46.5  PROCALCITON 0.32 0.23 <0.10    Hepatic Function Latest Ref Rng & Units 05/01/2020 04/30/2020 04/29/2020  Total Protein 6.5 - 8.1 g/dL 6.1(L) 6.4(L) 7.2  Albumin 3.5 - 5.0 g/dL 3.0(L) 3.3(L) 3.6  AST 15 - 41 U/L 112(H) 149(H) 162(H)  ALT 0 - 44 U/L 126(H) 134(H) 143(H)  Alk Phosphatase 38 - 126 U/L 45 44 51  Total Bilirubin 0.3 - 1.2 mg/dL 0.7 0.6 0.9     2.  COVID-19 viral illness induced mild asymptomatic transaminitis.  Trend  3.  Viral illness induced gastroenteritis.  Supportive care.   4.  Dehydration.  Resolved after IV fluids.    Condition - Extremely Guarded  Family Communication  :  None  Code Status :  Full  Consults  :  None  Disposition Plan  :    Status is: Inpatient  Remains inpatient appropriate because:IV treatments appropriate due to intensity of illness or inability to take PO   Dispo: The patient is from: Home              Anticipated d/c is to: Home              Anticipated d/c date is: > 3 days              Patient currently is medically stable to d/c.  Has severe acute hypoxic respiratory failure due to COVID-19 pneumonia and is finishing treatment for that.  Procedures  :  None  CT angiogram.  PUD  Prophylaxis : None  DVT Prophylaxis  :  Lovenox   Lab Results  Component Value Date   PLT 253 05/01/2020    Diet :  Diet Order            Diet regular Room service appropriate? Yes; Fluid consistency: Thin  Diet effective now               Inpatient Medications  Scheduled Meds: . vitamin C  500 mg Oral Daily  . chlorpheniramine-HYDROcodone  5 mL Oral Q12H  . citalopram  20 mg Oral Daily  . enoxaparin (LOVENOX) injection  55 mg Subcutaneous Q24H  . furosemide  20 mg Intravenous Once  . Ipratropium-Albuterol  1 puff Inhalation Q6H  . methylPREDNISolone (SOLU-MEDROL) injection  60 mg Intravenous Q12H  . multivitamin with minerals  1 tablet Oral Daily  . zinc sulfate  220 mg Oral Daily   Continuous Infusions: . remdesivir 100 mg in NS 100 mL 100 mg (05/01/20 0935)  . tocilizumab (ACTEMRA) - non-COVID treatment     PRN Meds:.acetaminophen **OR** [DISCONTINUED] acetaminophen, albuterol, guaiFENesin-dextromethorphan, [DISCONTINUED] ondansetron **OR** ondansetron (ZOFRAN) IV  Antibiotics  :    Anti-infectives (From admission, onward)   Start     Dose/Rate Route Frequency Ordered Stop   04/30/20 1000  remdesivir 100 mg in sodium chloride 0.9 % 100 mL IVPB     100 mg 200 mL/hr over 30 Minutes Intravenous Daily 04/29/20 1911 05/04/20 0959   04/29/20 1915  remdesivir 200 mg in sodium chloride 0.9% 250 mL IVPB  200 mg 580 mL/hr over 30 Minutes Intravenous Once 04/29/20 1911 04/30/20 0424       Time Spent in minutes  30   Lala Lund M.D on 05/01/2020 at 10:17 AM  To page go to [email or web address] - password [email or web address]  [facility] -  Office  [phone]   See all Orders from today for further details    Objective:   Vitals:   04/30/20 1215 04/30/20 1629 04/30/20 2000 05/01/20 0532  BP:  108/66 105/63 (!) 102/59  Pulse:  83  (!) 58  Resp:  (!) _0 Temp: 98.1 F (36.7 C) 98 F (36.7 C) 98.3 F (36.8 C) 97.8 F (36.6 C)  TempSrc: Oral Oral Oral Oral    SpO2:  92%  95%  Weight:      Height:        Wt Readings from Last 3 Encounters:  04/29/20 114 kg  10/22/18 98.4 kg  09/07/18 97.8 kg     Intake/Output Summary (Last 24 hours) at 05/01/2020 1017 Last data filed at 05/01/2020 0900 Gross per 24 hour  Intake 966.22 ml  Output --  Net 966.22 ml     Physical Exam  Awake Alert, No new F.N deficits, Normal affect [place].AT,PERRAL Supple Neck,No JVD, No cervical lymphadenopathy appriciated.  Symmetrical Chest wall movement, Good air movement bilaterally, CTAB RRR,No Gallops, Rubs or new Murmurs, No Parasternal Heave +ve B.Sounds, Abd Soft, No tenderness, No organomegaly appriciated, No rebound - guarding or rigidity. No Cyanosis, Clubbing or edema, No new Rash or bruise   Data Review:    CBC Recent Labs  Lab 04/29/20 1644 04/30/20 0545 05/01/20 0847  WBC 4.9 3.3* 4.7  HGB 15.4* 14.4 13.9  HCT 48.8* 45.5 44.3  PLT 170 175 253  MCV 89.2 91.0 90.4  MCH 28.2 28.8 28.4  MCHC 31.6 31.6 31.4  RDW 14.2 14.4 14.4  LYMPHSABS 1.1  --  PENDING  MONOABS 0.3  --  PENDING  EOSABS 0.0  --  PENDING  BASOSABS 0.0  --  PENDING    Chemistries  Recent Labs  Lab 04/29/20 1644 04/30/20 0545 05/01/20 0349  NA 139 141 141  K 3.6 4.7 4.6  CL 103 110 110  CO2 22 19* 21*  GLUCOSE 105* 120* 152*  BUN _1 CREATININE 0.97 0.72 0.72  CALCIUM 8.7* 8.5* 8.6*  AST 162* 149* 112*  ALT 143* 134* 126*  ALKPHOS 51 44 45  BILITOT 0.9 0.6 0.7  MG  --  2.4 2.2     ------------------------------------------------------------------------------------------------------------------ Recent Labs    04/29/20 1644  TRIG 53    No results found for: HGBA1C ------------------------------------------------------------------------------------------------------------------ No results for input(s): TSH, T4TOTAL, T3FREE, THYROIDAB in the last 72 hours.  Invalid input(s): FREET3  Cardiac Enzymes No results for input(s): CKMB, TROPONINI,  MYOGLOBIN in the last 168 hours.  Invalid input(s): CK ------------------------------------------------------------------------------------------------------------------    Component Value Date/Time   BNP 46.5 05/01/2020 0349    Micro Results Recent Results (from the past 240 hour(s))  Blood Culture (routine x 2)     Status: None (Preliminary result)   Collection Time: 04/29/20  4:25 PM   Specimen: BLOOD RIGHT HAND  Result Value Ref Range Status   Specimen Description BLOOD RIGHT HAND  Final   Special Requests   Final    BOTTLES DRAWN AEROBIC AND ANAEROBIC Blood Culture results may not be optimal due to an inadequate volume of blood received in culture bottles   Culture  Final    NO GROWTH < 24 HOURS Performed at [facility], [place] [address] [place], [place] [address]    Report Status PENDING  Incomplete  Blood Culture (routine x 2)     Status: None (Preliminary result)   Collection Time: 04/29/20  5:09 PM   Specimen: BLOOD  Result Value Ref Range Status   Specimen Description BLOOD RIGHT ANTECUBITAL  Final   Special Requests   Final    BOTTLES DRAWN AEROBIC AND ANAEROBIC Blood Culture adequate volume   Culture   Final    NO GROWTH < 24 HOURS Performed at [facility], [place] [address], [place], [place] [address]    Report Status PENDING  Incomplete    Radiology Reports DG Chest Port 1 View  Result Date: 04/29/2020 CLINICAL DATA:  COVID-19 positive, shortness of breath, fevers and chills EXAM: PORTABLE CHEST 1 VIEW COMPARISON:  Radiograph 02/24/2014 FINDINGS: Lung volumes are markedly diminished with basilar atelectasis and superimposed areas of consolidative opacity predominantly along the periphery of the left lung and partially silhouetting the right hemidiaphragm and right heart border. No pneumothorax. Possible small right effusion. No sizable left effusion. Cardiomediastinal contours are partially obscured by opacity. No acute osseous or soft tissue  abnormality. Telemetry leads overlie the chest. IMPRESSION: Diminished lung volumes with basilar atelectasis and superimposed areas of consolidative opacity compatible with a multifocal pneumonia in the setting of COVID-19 positivity. Suspect small right effusion as well, could be better determined with upright PA and lateral radiograph. Electronically Signed   By: Lovena Le M.D.   On: 04/29/2020 16:10

## 2020-05-02 LAB — CBC WITH DIFFERENTIAL/PLATELET
Abs Immature Granulocytes: 0.2 10*3/uL — ABNORMAL HIGH (ref 0.00–0.07)
Basophils Absolute: 0 10*3/uL (ref 0.0–0.1)
Basophils Relative: 0 %
Eosinophils Absolute: 0 10*3/uL (ref 0.0–0.5)
Eosinophils Relative: 0 %
HCT: 43.2 % (ref 36.0–46.0)
Hemoglobin: 14 g/dL (ref 12.0–15.0)
Immature Granulocytes: 4 %
Lymphocytes Relative: 29 %
Lymphs Abs: 1.5 10*3/uL (ref 0.7–4.0)
MCH: 29.2 pg (ref 26.0–34.0)
MCHC: 32.4 g/dL (ref 30.0–36.0)
MCV: 90 fL (ref 80.0–100.0)
Monocytes Absolute: 0.6 10*3/uL (ref 0.1–1.0)
Monocytes Relative: 11 %
Neutro Abs: 2.8 10*3/uL (ref 1.7–7.7)
Neutrophils Relative %: 56 %
Platelets: 301 10*3/uL (ref 150–400)
RBC: 4.8 MIL/uL (ref 3.87–5.11)
RDW: 14.2 % (ref 11.5–15.5)
WBC: 5.1 10*3/uL (ref 4.0–10.5)
nRBC: 0 % (ref 0.0–0.2)

## 2020-05-02 LAB — COMPREHENSIVE METABOLIC PANEL
ALT: 119 U/L — ABNORMAL HIGH (ref 0–44)
AST: 70 U/L — ABNORMAL HIGH (ref 15–41)
Albumin: 3.2 g/dL — ABNORMAL LOW (ref 3.5–5.0)
Alkaline Phosphatase: 46 U/L (ref 38–126)
Anion gap: 10 (ref 5–15)
BUN: 13 mg/dL (ref 6–20)
CO2: 26 mmol/L (ref 22–32)
Calcium: 8.6 mg/dL — ABNORMAL LOW (ref 8.9–10.3)
Chloride: 105 mmol/L (ref 98–111)
Creatinine, Ser: 0.7 mg/dL (ref 0.44–1.00)
GFR calc Af Amer: >60 mL/min (ref >60)
GFR calc non Af Amer: >60 mL/min (ref >60)
Glucose, Bld: 160 mg/dL — ABNORMAL HIGH (ref 70–99)
Potassium: 4 mmol/L (ref 3.5–5.1)
Sodium: 141 mmol/L (ref 135–145)
Total Bilirubin: 0.6 mg/dL (ref 0.3–1.2)
Total Protein: 6.5 g/dL (ref 6.5–8.1)

## 2020-05-02 LAB — C-REACTIVE PROTEIN: CRP: 1.8 mg/dL — ABNORMAL HIGH (ref <1.0)

## 2020-05-02 LAB — MAGNESIUM: Magnesium: 2.3 mg/dL (ref 1.7–2.4)

## 2020-05-02 LAB — PROCALCITONIN: Procalcitonin: <0.1 ng/mL

## 2020-05-02 LAB — D-DIMER, QUANTITATIVE: D-Dimer, Quant: 0.47 ug/mL-FEU (ref 0.00–0.50)

## 2020-05-02 LAB — BRAIN NATRIURETIC PEPTIDE: B Natriuretic Peptide: 62.4 pg/mL (ref 0.0–100.0)

## 2020-05-02 MED ORDER — METHYLPREDNISOLONE SODIUM SUCC 125 MG IJ SOLR
60.0000 mg | Freq: Every day | INTRAMUSCULAR | Status: DC
  Administered 2020-05-02 – 2020-05-04 (×3): 60 mg via INTRAVENOUS
  Filled 2020-05-02 (×3): qty 2

## 2020-05-02 MED ORDER — FUROSEMIDE 10 MG/ML IJ SOLN
40.0000 mg | Freq: Once | INTRAMUSCULAR | Status: AC
  Administered 2020-05-02: 40 mg via INTRAVENOUS
  Filled 2020-05-02: qty 4

## 2020-05-02 NOTE — Plan of Care (Signed)
  Problem: Respiratory: Goal: Will maintain a patent airway Outcome: Progressing Goal: Complications related to the disease process, condition or treatment will be avoided or minimized Outcome: Progressing   

## 2020-05-02 NOTE — Progress Notes (Signed)
PROGRESS NOTE                                                                                                                                                                                                             Patient Demographics:    Diane Shelton, is a 24 y.o. female, DOB - [birth year], KTG:[id number]  Outpatient Primary MD for the patient is Patient, No Pcp Per    LOS - 3  Admit date - 04/29/2020    Chief Complaint  Patient presents with   Covid Exposure   Emesis   Diarrhea   Fever       Brief Narrative - Diane Shelton is a 24 y.o. female with medical history significant of obesity who presents with dyspnea.  Patient reports 7 days of not feeling well, consistent with malaise, generalized weakness, nausea, vomiting, diarrhea and poor appetite, she presented to [facility], ER where she was diagnosed with acute hypoxic respiratory failure due to COVID-19 pneumonia and admitted to the hospital.  At [facility] she received IV steroids, her first dose of remdesivir along with Actemra.   Subjective:   Patient in bed, appears comfortable, denies any headache, no fever, no chest pain or pressure, +ve shortness of breath , no abdominal pain. No focal weakness.    Assessment  & Plan :     1. Acute Hypoxic Resp. Failure due to Acute Covid 19 Viral Pneumonitis during the ongoing 2020 Covid 19 Pandemic - she has severe disease she was appropriately treated at [facility] with IV steroids ( taper) , remdesivir and Actemra, unfortunately on 05/01/2020 she continues to be extremely short of breath even upon minimal ambulation, temporarily had to be placed on 8 L ( from 3) oxygen by me on 05/01/2020 I suspect due to her obesity her first dose of Actemra was underdosed hence I will repeated. CTA chest negative, continue supportive care, still quite sick.   Encouraged the patient to sit up in chair in the daytime use  I-S and flutter valve for pulmonary toiletry and then prone in bed when at night.  Will advance activity and titrate down oxygen as possible.   SpO2: 95 % O2 Flow Rate (L/min): 7 L/min  Recent Labs  Lab 04/29/20 1644 04/30/20 0545 05/01/20 0349 05/02/20 0735  CRP 11.7* 9.6* 4.4* 1.8*  DDIMER  1.42* 1.06* 0.73* 0.47  BNP  --  41.7 46.5  --   PROCALCITON 0.32 0.23 <0.10  --     Hepatic Function Latest Ref Rng & Units 05/02/2020 05/01/2020 04/30/2020  Total Protein 6.5 - 8.1 g/dL 6.5 6.1(L) 6.4(L)  Albumin 3.5 - 5.0 g/dL 3.2(L) 3.0(L) 3.3(L)  AST 15 - 41 U/L 70(H) 112(H) 149(H)  ALT 0 - 44 U/L 119(H) 126(H) 134(H)  Alk Phosphatase 38 - 126 U/L 46 45 44  Total Bilirubin 0.3 - 1.2 mg/dL 0.6 0.7 0.6     2.  COVID-19 viral illness induced mild asymptomatic transaminitis.  Trend  3.  Viral illness induced gastroenteritis.  Supportive care.   4.  Dehydration.  Resolved after IV fluids.    Condition - Extremely Guarded  Family Communication  :  None  Code Status :  Full  Consults  :  None  Disposition Plan  :    Status is: Inpatient  Remains inpatient appropriate because:IV treatments appropriate due to intensity of illness or inability to take PO   Dispo: The patient is from: Home              Anticipated d/c is to: Home              Anticipated d/c date is: > 3 days              Patient currently is medically stable to d/c.  Has severe acute hypoxic respiratory failure due to COVID-19 pneumonia and is finishing treatment for that.  Procedures  :  None  CT angiogram.  PUD Prophylaxis : None  DVT Prophylaxis  :  Lovenox   Lab Results  Component Value Date   PLT 301 05/02/2020    Diet :  Diet Order            Diet regular Room service appropriate? Yes; Fluid consistency: Thin  Diet effective now               Inpatient Medications  Scheduled Meds:  vitamin C  500 mg Oral Daily   chlorpheniramine-HYDROcodone  5 mL Oral Q12H   citalopram  20 mg  Oral Daily   enoxaparin (LOVENOX) injection  55 mg Subcutaneous Q24H   furosemide  40 mg Intravenous Once   Ipratropium-Albuterol  1 puff Inhalation Q6H   methylPREDNISolone (SOLU-MEDROL) injection  60 mg Intravenous Daily   multivitamin with minerals  1 tablet Oral Daily   zinc sulfate  220 mg Oral Daily   Continuous Infusions:  remdesivir 100 mg in NS 100 mL 100 mg (05/02/20 0832)   PRN Meds:.acetaminophen **OR** [DISCONTINUED] acetaminophen, albuterol, guaiFENesin-dextromethorphan, [DISCONTINUED] ondansetron **OR** ondansetron (ZOFRAN) IV  Antibiotics  :    Anti-infectives (From admission, onward)   Start     Dose/Rate Route Frequency Ordered Stop   04/30/20 1000  remdesivir 100 mg in sodium chloride 0.9 % 100 mL IVPB     100 mg 200 mL/hr over 30 Minutes Intravenous Daily 04/29/20 1911 05/04/20 0959   04/29/20 1915  remdesivir 200 mg in sodium chloride 0.9% 250 mL IVPB     200 mg 580 mL/hr over 30 Minutes Intravenous Once 04/29/20 1911 04/30/20 0424       Time Spent in minutes  30   Lala Lund M.D on 05/02/2020 at 9:52 AM  To page go to [email or web address] - password [email or web address]  [facility] -  Office  [phone]   See all Orders from today for further details  Objective:   Vitals:   05/01/20 1211 05/01/20 1600 05/01/20 2005 05/02/20 0400  BP:  114/63 122/64 103/60  Pulse:  92 69 65  Resp: 17  18 18   Temp: 98.6 F (37 C) 98 F (36.7 C) 98.7 F (37.1 C) 98 F (36.7 C)  TempSrc: Oral Oral Oral Oral  SpO2:  94% 92% 95%  Weight:      Height:        Wt Readings from Last 3 Encounters:  04/29/20 114 kg  10/22/18 98.4 kg  09/07/18 97.8 kg     Intake/Output Summary (Last 24 hours) at 05/02/2020 0952 Last data filed at 05/02/2020 0853 Gross per 24 hour  Intake 680 ml  Output --  Net 680 ml     Physical Exam  Awake Alert, No new F.N deficits, Normal affect [place].AT,PERRAL Supple Neck,No JVD, No cervical lymphadenopathy appriciated.   Symmetrical Chest wall movement, Good air movement bilaterally, few rales RRR,No Gallops, Rubs or new Murmurs, No Parasternal Heave +ve B.Sounds, Abd Soft, No tenderness, No organomegaly appriciated, No rebound - guarding or rigidity. No Cyanosis, Clubbing or edema, No new Rash or bruise    Data Review:    CBC Recent Labs  Lab 04/29/20 1644 04/30/20 0545 05/01/20 0847 05/02/20 0735  WBC 4.9 3.3* 4.7 5.1  HGB 15.4* 14.4 13.9 14.0  HCT 48.8* 45.5 44.3 43.2  PLT 170 175 253 301  MCV 89.2 91.0 90.4 90.0  MCH 28.2 28.8 28.4 29.2  MCHC 31.6 31.6 31.4 32.4  RDW 14.2 14.4 14.4 14.2  LYMPHSABS 1.1  --  1.7 1.5  MONOABS 0.3  --  0.4 0.6  EOSABS 0.0  --  0.0 0.0  BASOSABS 0.0  --  0.0 0.0    Chemistries  Recent Labs  Lab 04/29/20 1644 04/30/20 0545 05/01/20 0349 05/02/20 0735  NA 139 141 141 141  K 3.6 4.7 4.6 4.0  CL 103 110 110 105  CO2 22 19* 21* 26  GLUCOSE 105* 120* 152* 160*  BUN 8 8 11 13   CREATININE 0.97 0.72 0.72 0.70  CALCIUM 8.7* 8.5* 8.6* 8.6*  AST 162* 149* 112* 70*  ALT 143* 134* 126* 119*  ALKPHOS 51 44 45 46  BILITOT 0.9 0.6 0.7 0.6  MG  --  2.4 2.2 2.3     ------------------------------------------------------------------------------------------------------------------ Recent Labs    04/29/20 1644  TRIG 53    No results found for: HGBA1C ------------------------------------------------------------------------------------------------------------------ No results for input(s): TSH, T4TOTAL, T3FREE, THYROIDAB in the last 72 hours.  Invalid input(s): FREET3  Cardiac Enzymes No results for input(s): CKMB, TROPONINI, MYOGLOBIN in the last 168 hours.  Invalid input(s): CK ------------------------------------------------------------------------------------------------------------------    Component Value Date/Time   BNP 46.5 05/01/2020 0349    Micro Results Recent Results (from the past 240 hour(s))  Blood Culture (routine x 2)     Status:  None (Preliminary result)   Collection Time: 04/29/20  4:25 PM   Specimen: BLOOD RIGHT HAND  Result Value Ref Range Status   Specimen Description BLOOD RIGHT HAND  Final   Special Requests   Final    BOTTLES DRAWN AEROBIC AND ANAEROBIC Blood Culture results may not be optimal due to an inadequate volume of blood received in culture bottles   Culture   Final    NO GROWTH 3 DAYS Performed at [facility], [place] [address] [place], [place] [address]    Report Status PENDING  Incomplete  Blood Culture (routine x 2)  Status: None (Preliminary result)   Collection Time: 04/29/20  5:09 PM   Specimen: BLOOD  Result Value Ref Range Status   Specimen Description BLOOD RIGHT ANTECUBITAL  Final   Special Requests   Final    BOTTLES DRAWN AEROBIC AND ANAEROBIC Blood Culture adequate volume   Culture   Final    NO GROWTH 3 DAYS Performed at [facility], [address], [place], [place] [address]    Report Status PENDING  Incomplete    Radiology Reports CT ANGIO CHEST PE W OR WO CONTRAST  Result Date: 05/01/2020 CLINICAL DATA:  Shortness of breath, malaise.  COVID positive. EXAM: CT ANGIOGRAPHY CHEST WITH CONTRAST TECHNIQUE: Multidetector CT imaging of the chest was performed using the standard protocol during bolus administration of intravenous contrast. Multiplanar CT image reconstructions and MIPs were obtained to evaluate the vascular anatomy. CONTRAST:  49m OMNIPAQUE IOHEXOL 350 MG/ML SOLN COMPARISON:  None. FINDINGS: Cardiovascular: Heart size upper limits normal. No significant pericardial effusion. Satisfactory opacification of pulmonary arteries noted, and there is no evidence of pulmonary emboli. Fair contrast opacification of the thoracic aorta, which is nondilated. Mediastinum/Nodes: No hilar or mediastinal adenopathy. Lungs/Pleura: No significant pleural effusion. No pneumothorax. Extensive scattered patchy airspace opacities in primarily in the lung bases with  consolidation posteriorly, and scattered ground-glass opacities throughout the lungs with a predominantly peripheral distribution. Upper Abdomen: No acute findings Musculoskeletal: No chest wall abnormality. No acute or significant osseous findings. Review of the MIP images confirms the above findings. IMPRESSION: 1. Negative for acute PE. 2. Extensive bilateral pulmonary airspace and ground-glass opacities as above, may represent infectious/inflammatory etiology versus atypical edema. Electronically Signed   By: DLucrezia EuropeM.D.   On: 05/01/2020 16:06   DG Chest Port 1 View  Result Date: 04/29/2020 CLINICAL DATA:  COVID-19 positive, shortness of breath, fevers and chills EXAM: PORTABLE CHEST 1 VIEW COMPARISON:  Radiograph 02/24/2014 FINDINGS: Lung volumes are markedly diminished with basilar atelectasis and superimposed areas of consolidative opacity predominantly along the periphery of the left lung and partially silhouetting the right hemidiaphragm and right heart border. No pneumothorax. Possible small right effusion. No sizable left effusion. Cardiomediastinal contours are partially obscured by opacity. No acute osseous or soft tissue abnormality. Telemetry leads overlie the chest. IMPRESSION: Diminished lung volumes with basilar atelectasis and superimposed areas of consolidative opacity compatible with a multifocal pneumonia in the setting of COVID-19 positivity. Suspect small right effusion as well, could be better determined with upright PA and lateral radiograph. Electronically Signed   By: PLovena LeM.D.   On: 04/29/2020 16:10

## 2020-05-03 LAB — D-DIMER, QUANTITATIVE: D-Dimer, Quant: 0.38 ug/mL-FEU (ref 0.00–0.50)

## 2020-05-03 LAB — COMPREHENSIVE METABOLIC PANEL
ALT: 158 U/L — ABNORMAL HIGH (ref 0–44)
AST: 116 U/L — ABNORMAL HIGH (ref 15–41)
Albumin: 3.4 g/dL — ABNORMAL LOW (ref 3.5–5.0)
Alkaline Phosphatase: 42 U/L (ref 38–126)
Anion gap: 11 (ref 5–15)
BUN: 16 mg/dL (ref 6–20)
CO2: 27 mmol/L (ref 22–32)
Calcium: 8.8 mg/dL — ABNORMAL LOW (ref 8.9–10.3)
Chloride: 103 mmol/L (ref 98–111)
Creatinine, Ser: 0.89 mg/dL (ref 0.44–1.00)
GFR calc Af Amer: >60 mL/min (ref >60)
GFR calc non Af Amer: >60 mL/min (ref >60)
Glucose, Bld: 115 mg/dL — ABNORMAL HIGH (ref 70–99)
Potassium: 4.1 mmol/L (ref 3.5–5.1)
Sodium: 141 mmol/L (ref 135–145)
Total Bilirubin: 0.4 mg/dL (ref 0.3–1.2)
Total Protein: 6.5 g/dL (ref 6.5–8.1)

## 2020-05-03 LAB — CBC WITH DIFFERENTIAL/PLATELET
Abs Immature Granulocytes: 0.3 10*3/uL — ABNORMAL HIGH (ref 0.00–0.07)
Basophils Absolute: 0 10*3/uL (ref 0.0–0.1)
Basophils Relative: 1 %
Eosinophils Absolute: 0 10*3/uL (ref 0.0–0.5)
Eosinophils Relative: 0 %
HCT: 45 % (ref 36.0–46.0)
Hemoglobin: 14.3 g/dL (ref 12.0–15.0)
Immature Granulocytes: 5 %
Lymphocytes Relative: 39 %
Lymphs Abs: 2.6 10*3/uL (ref 0.7–4.0)
MCH: 28.7 pg (ref 26.0–34.0)
MCHC: 31.8 g/dL (ref 30.0–36.0)
MCV: 90.2 fL (ref 80.0–100.0)
Monocytes Absolute: 0.9 10*3/uL (ref 0.1–1.0)
Monocytes Relative: 13 %
Neutro Abs: 2.8 10*3/uL (ref 1.7–7.7)
Neutrophils Relative %: 42 %
Platelets: 326 10*3/uL (ref 150–400)
RBC: 4.99 MIL/uL (ref 3.87–5.11)
RDW: 14 % (ref 11.5–15.5)
WBC: 6.6 10*3/uL (ref 4.0–10.5)
nRBC: 0.3 % — ABNORMAL HIGH (ref 0.0–0.2)

## 2020-05-03 LAB — PROCALCITONIN: Procalcitonin: <0.1 ng/mL

## 2020-05-03 LAB — C-REACTIVE PROTEIN: CRP: 0.9 mg/dL (ref <1.0)

## 2020-05-03 LAB — BRAIN NATRIURETIC PEPTIDE: B Natriuretic Peptide: 50.5 pg/mL (ref 0.0–100.0)

## 2020-05-03 LAB — MAGNESIUM: Magnesium: 2.4 mg/dL (ref 1.7–2.4)

## 2020-05-03 NOTE — Progress Notes (Signed)
PROGRESS NOTE                                                                                                                                                                                                             Patient Demographics:    Diane Shelton, is a 24 y.o. female, DOB - 02-18-1996, FUX:[id number]  Outpatient Primary MD for the patient is Patient, No Pcp Per    LOS - 4  Admit date - 04/29/2020    Chief Complaint  Patient presents with   Covid Exposure   Emesis   Diarrhea   Fever       Brief Narrative - Diane Shelton is a 24 y.o. female with medical history significant of obesity who presents with dyspnea.  Patient reports 7 days of not feeling well, consistent with malaise, generalized weakness, nausea, vomiting, diarrhea and poor appetite, she presented to [facility], ER where she was diagnosed with acute hypoxic respiratory failure due to COVID-19 pneumonia and admitted to the hospital.  At [facility] she received IV steroids, her first dose of remdesivir along with Actemra.   Subjective:   Patient in bed, appears comfortable, denies any headache, no fever, no chest pain or pressure, no shortness of breath , no abdominal pain. No focal weakness.   Assessment  & Plan :     1. Acute Hypoxic Resp. Failure due to Acute Covid 19 Viral Pneumonitis during the ongoing 2020 Covid 19 Pandemic - she has severe disease she was appropriately treated at [facility] with IV steroids ( taper) , remdesivir and Actemra x 2,  CTA chest negative, continue supportive care, still quite sick although on 05/04/2019 when she started to show signs of improvement, now down from 8 L to 6 L and states she feels better.  Continue to monitor.  Encouraged the patient to sit up in chair in the daytime use I-S and flutter valve for pulmonary toiletry and then prone in bed when at night.  Will advance activity and titrate down  oxygen as possible.   SpO2: 96 % O2 Flow Rate (L/min): 6 L/min  Recent Labs  Lab 04/29/20 1644 04/30/20 0545 05/01/20 0349 05/02/20 0735 05/03/20 0809  CRP 11.7* 9.6* 4.4* 1.8* 0.9  DDIMER 1.42* 1.06* 0.73* 0.47 0.38  BNP  --  41.7 46.5 62.4 50.5  PROCALCITON  0.32 0.23 <0.10 <0.10  --     Hepatic Function Latest Ref Rng & Units 05/03/2020 05/02/2020 05/01/2020  Total Protein 6.5 - 8.1 g/dL 6.5 6.5 6.1(L)  Albumin 3.5 - 5.0 g/dL 3.4(L) 3.2(L) 3.0(L)  AST 15 - 41 U/L 116(H) 70(H) 112(H)  ALT 0 - 44 U/L 158(H) 119(H) 126(H)  Alk Phosphatase 38 - 126 U/L 42 46 45  Total Bilirubin 0.3 - 1.2 mg/dL 0.4 0.6 0.7     2.  COVID-19 viral illness plus Remdesivir induced mild asymptomatic transaminitis.  Trend is slightly worsening in the setting of remdesivir finish 5 doses and continue to monitor closely.  3.  Viral illness induced gastroenteritis.  Supportive care.   4.  Dehydration.  Resolved after IV fluids.    Condition - Extremely Guarded  Family Communication  :  None  Code Status :  Full  Consults  :  None  Disposition Plan  :    Status is: Inpatient  Remains inpatient appropriate because:IV treatments appropriate due to intensity of illness or inability to take PO   Dispo: The patient is from: Home              Anticipated d/c is to: Home              Anticipated d/c date is: > 3 days              Patient currently is medically stable to d/c.  Has severe acute hypoxic respiratory failure due to COVID-19 pneumonia and is finishing treatment for that.  Procedures  :  None  CT angiogram.  PUD Prophylaxis : None  DVT Prophylaxis  :  Lovenox   Lab Results  Component Value Date   PLT 326 05/03/2020    Diet :  Diet Order            Diet regular Room service appropriate? Yes; Fluid consistency: Thin  Diet effective now               Inpatient Medications  Scheduled Meds:  vitamin C  500 mg Oral Daily   chlorpheniramine-HYDROcodone  5 mL Oral Q12H     citalopram  20 mg Oral Daily   enoxaparin (LOVENOX) injection  55 mg Subcutaneous Q24H   Ipratropium-Albuterol  1 puff Inhalation Q6H   methylPREDNISolone (SOLU-MEDROL) injection  60 mg Intravenous Daily   multivitamin with minerals  1 tablet Oral Daily   zinc sulfate  220 mg Oral Daily   Continuous Infusions:  remdesivir 100 mg in NS 100 mL 100 mg (05/03/20 1024)   PRN Meds:.acetaminophen **OR** [DISCONTINUED] acetaminophen, albuterol, guaiFENesin-dextromethorphan, [DISCONTINUED] ondansetron **OR** ondansetron (ZOFRAN) IV  Antibiotics  :    Anti-infectives (From admission, onward)   Start     Dose/Rate Route Frequency Ordered Stop   04/30/20 1000  remdesivir 100 mg in sodium chloride 0.9 % 100 mL IVPB     100 mg 200 mL/hr over 30 Minutes Intravenous Daily 04/29/20 1911 05/04/20 0959   04/29/20 1915  remdesivir 200 mg in sodium chloride 0.9% 250 mL IVPB     200 mg 580 mL/hr over 30 Minutes Intravenous Once 04/29/20 1911 04/30/20 0424       Time Spent in minutes  30   Lala Lund M.D on 05/03/2020 at 10:53 AM  To page go to [email or web address] - password [email or web address]  [facility] -  Office  [phone]   See all Orders from today for further details    Objective:   Vitals:  05/02/20 1615 05/02/20 2000 05/03/20 0400 05/03/20 0740  BP: 114/61 113/67 114/65 99/64  Pulse: 71 76 64 65  Resp: 18 20 18 18   Temp: 98.4 F (36.9 C) (!) 97.5 F (36.4 C) 97.7 F (36.5 C) 98 F (36.7 C)  TempSrc: Oral Oral Oral   SpO2: 94% 94% 95% 96%  Weight:      Height:        Wt Readings from Last 3 Encounters:  04/29/20 114 kg  10/22/18 98.4 kg  09/07/18 97.8 kg     Intake/Output Summary (Last 24 hours) at 05/03/2020 1053 Last data filed at 05/03/2020 1030 Gross per 24 hour  Intake 840 ml  Output --  Net 840 ml     Physical Exam   Awake Alert, No new F.N deficits, Normal affect Conrad.AT,PERRAL Supple Neck,No JVD, No cervical lymphadenopathy appriciated.   Symmetrical Chest wall movement, Good air movement bilaterally, CTAB RRR,No Gallops, Rubs or new Murmurs, No Parasternal Heave +ve B.Sounds, Abd Soft, No tenderness, No organomegaly appriciated, No rebound - guarding or rigidity. No Cyanosis, Clubbing or edema, No new Rash or bruise    Data Review:    CBC Recent Labs  Lab 04/29/20 1644 04/30/20 0545 05/01/20 0847 05/02/20 0735 05/03/20 0809  WBC 4.9 3.3* 4.7 5.1 6.6  HGB 15.4* 14.4 13.9 14.0 14.3  HCT 48.8* 45.5 44.3 43.2 45.0  PLT 170 175 253 301 326  MCV 89.2 91.0 90.4 90.0 90.2  MCH 28.2 28.8 28.4 29.2 28.7  MCHC 31.6 31.6 31.4 32.4 31.8  RDW 14.2 14.4 14.4 14.2 14.0  LYMPHSABS 1.1  --  1.7 1.5 2.6  MONOABS 0.3  --  0.4 0.6 0.9  EOSABS 0.0  --  0.0 0.0 0.0  BASOSABS 0.0  --  0.0 0.0 0.0    Chemistries  Recent Labs  Lab 04/29/20 1644 04/30/20 0545 05/01/20 0349 05/02/20 0735 05/03/20 0809  NA 139 141 141 141 141  K 3.6 4.7 4.6 4.0 4.1  CL 103 110 110 105 103  CO2 22 19* 21* 26 27  GLUCOSE 105* 120* 152* 160* 115*  BUN 8 8 11 13 16   CREATININE 0.97 0.72 0.72 0.70 0.89  CALCIUM 8.7* 8.5* 8.6* 8.6* 8.8*  AST 162* 149* 112* 70* 116*  ALT 143* 134* 126* 119* 158*  ALKPHOS 51 44 45 46 42  BILITOT 0.9 0.6 0.7 0.6 0.4  MG  --  2.4 2.2 2.3 2.4     ------------------------------------------------------------------------------------------------------------------ No results for input(s): CHOL, HDL, LDLCALC, TRIG, CHOLHDL, LDLDIRECT in the last 72 hours.  No results found for: HGBA1C ------------------------------------------------------------------------------------------------------------------ No results for input(s): TSH, T4TOTAL, T3FREE, THYROIDAB in the last 72 hours.  Invalid input(s): FREET3  Cardiac Enzymes No results for input(s): CKMB, TROPONINI, MYOGLOBIN in the last 168 hours.  Invalid input(s):  CK ------------------------------------------------------------------------------------------------------------------    Component Value Date/Time   BNP 50.5 05/03/2020 0809    Micro Results Recent Results (from the past 240 hour(s))  Blood Culture (routine x 2)     Status: None (Preliminary result)   Collection Time: 04/29/20  4:25 PM   Specimen: BLOOD RIGHT HAND  Result Value Ref Range Status   Specimen Description BLOOD RIGHT HAND  Final   Special Requests   Final    BOTTLES DRAWN AEROBIC AND ANAEROBIC Blood Culture results may not be optimal due to an inadequate volume of blood received in culture bottles   Culture   Final    NO GROWTH 4 DAYS Performed at [facility], [place] [address], [place], [place] [address]    Report Status PENDING  Incomplete  Blood Culture (routine x 2)     Status: None (Preliminary result)   Collection Time: 04/29/20  5:09 PM   Specimen: BLOOD  Result Value Ref Range Status   Specimen Description BLOOD RIGHT ANTECUBITAL  Final   Special Requests   Final    BOTTLES DRAWN AEROBIC AND ANAEROBIC Blood Culture adequate volume   Culture   Final    NO GROWTH 4 DAYS Performed at [facility], [place] [address], [place], [place] [address]    Report Status PENDING  Incomplete    Radiology Reports CT ANGIO CHEST PE W OR WO CONTRAST  Result Date: 05/01/2020 CLINICAL DATA:  Shortness of breath, malaise.  COVID positive. EXAM: CT ANGIOGRAPHY CHEST WITH CONTRAST TECHNIQUE: Multidetector CT imaging of the chest was performed using the standard protocol during bolus administration of intravenous contrast. Multiplanar CT image reconstructions and MIPs were obtained to evaluate the vascular anatomy. CONTRAST:  46m OMNIPAQUE IOHEXOL 350 MG/ML SOLN COMPARISON:  None. FINDINGS: Cardiovascular: Heart size upper limits normal. No significant pericardial effusion. Satisfactory opacification of pulmonary arteries noted, and there is no evidence of pulmonary emboli.  Fair contrast opacification of the thoracic aorta, which is nondilated. Mediastinum/Nodes: No hilar or mediastinal adenopathy. Lungs/Pleura: No significant pleural effusion. No pneumothorax. Extensive scattered patchy airspace opacities in primarily in the lung bases with consolidation posteriorly, and scattered ground-glass opacities throughout the lungs with a predominantly peripheral distribution. Upper Abdomen: No acute findings Musculoskeletal: No chest wall abnormality. No acute or significant osseous findings. Review of the MIP images confirms the above findings. IMPRESSION: 1. Negative for acute PE. 2. Extensive bilateral pulmonary airspace and ground-glass opacities as above, may represent infectious/inflammatory etiology versus atypical edema. Electronically Signed   By: DLucrezia EuropeM.D.   On: 05/01/2020 16:06   DG Chest Port 1 View  Result Date: 04/29/2020 CLINICAL DATA:  COVID-19 positive, shortness of breath, fevers and chills EXAM: PORTABLE CHEST 1 VIEW COMPARISON:  Radiograph 02/24/2014 FINDINGS: Lung volumes are markedly diminished with basilar atelectasis and superimposed areas of consolidative opacity predominantly along the periphery of the left lung and partially silhouetting the right hemidiaphragm and right heart border. No pneumothorax. Possible small right effusion. No sizable left effusion. Cardiomediastinal contours are partially obscured by opacity. No acute osseous or soft tissue abnormality. Telemetry leads overlie the chest. IMPRESSION: Diminished lung volumes with basilar atelectasis and superimposed areas of consolidative opacity compatible with a multifocal pneumonia in the setting of COVID-19 positivity. Suspect small right effusion as well, could be better determined with upright PA and lateral radiograph. Electronically Signed   By: PLovena LeM.D.   On: 04/29/2020 16:10

## 2020-05-03 NOTE — Plan of Care (Addendum)
Patient A&O x4. VSS. Pt  Titrated to  4 L of oxygen sating above 95%. Lungs sounds regular and  Diminished. Free from falls. Pt turns self in bed. Ambulated to chair independently. Pt tolerating fluids and meals.Pt voiding adequately during shift.DVT prophylactic: lovenox. No c/o of pain. Bed wheels locked. Phone and call bell within reach. Pt is resting, no distress. POC reviewed.    Problem: Education: Goal: Knowledge of risk factors and measures for prevention of condition will improve Outcome: Progressing   Problem: Coping: Goal: Psychosocial and spiritual needs will be supported Outcome: Progressing   Problem: Respiratory: Goal: Will maintain a patent airway Outcome: Progressing Goal: Complications related to the disease process, condition or treatment will be avoided or minimized Outcome: Progressing   Problem: Education: Goal: Knowledge of General Education information will improve Description: Including pain rating scale, medication(s)/side effects and non-pharmacologic comfort measures Outcome: Progressing   Goal: Will remain free from infection Outcome: Progressing Goal: Diagnostic test results will improve Outcome: Progressing Goal: Respiratory complications will improve Outcome: Progressing Goal: Cardiovascular complication will be avoided Outcome: Progressing   Problem: Activity: Goal: Risk for activity intolerance will decrease Outcome: Progressing   Problem: Nutrition: Goal: Adequate nutrition will be maintained Outcome: Progressing   Problem: Coping: Goal: Level of anxiety will decrease Outcome: Progressing   Problem: Elimination: Goal: Will not experience complications related to bowel motility Outcome: Progressing Goal: Will not experience complications related to urinary retention Outcome: Progressing   Problem: Pain Managment: Goal: General experience of comfort will improve Outcome: Progressing   Problem: Safety: Goal: Ability to remain free  from injury will improve Outcome: Progressing   Problem: Skin Integrity: Goal: Risk for impaired skin integrity will decrease Outcome: Progressing

## 2020-05-04 LAB — CBC WITH DIFFERENTIAL/PLATELET
Abs Immature Granulocytes: 0.32 10*3/uL — ABNORMAL HIGH (ref 0.00–0.07)
Basophils Absolute: 0.1 10*3/uL (ref 0.0–0.1)
Basophils Relative: 1 %
Eosinophils Absolute: 0 10*3/uL (ref 0.0–0.5)
Eosinophils Relative: 0 %
HCT: 44.7 % (ref 36.0–46.0)
Hemoglobin: 14.2 g/dL (ref 12.0–15.0)
Immature Granulocytes: 4 %
Lymphocytes Relative: 27 %
Lymphs Abs: 2.1 10*3/uL (ref 0.7–4.0)
MCH: 28.5 pg (ref 26.0–34.0)
MCHC: 31.8 g/dL (ref 30.0–36.0)
MCV: 89.6 fL (ref 80.0–100.0)
Monocytes Absolute: 0.9 10*3/uL (ref 0.1–1.0)
Monocytes Relative: 11 %
Neutro Abs: 4.5 10*3/uL (ref 1.7–7.7)
Neutrophils Relative %: 57 %
Platelets: 306 10*3/uL (ref 150–400)
RBC: 4.99 MIL/uL (ref 3.87–5.11)
RDW: 13.8 % (ref 11.5–15.5)
WBC: 7.9 10*3/uL (ref 4.0–10.5)
nRBC: 0 % (ref 0.0–0.2)

## 2020-05-04 LAB — D-DIMER, QUANTITATIVE: D-Dimer, Quant: 0.45 ug/mL-FEU (ref 0.00–0.50)

## 2020-05-04 LAB — COMPREHENSIVE METABOLIC PANEL
ALT: 230 U/L — ABNORMAL HIGH (ref 0–44)
AST: 108 U/L — ABNORMAL HIGH (ref 15–41)
Albumin: 3.3 g/dL — ABNORMAL LOW (ref 3.5–5.0)
Alkaline Phosphatase: 49 U/L (ref 38–126)
Anion gap: 8 (ref 5–15)
BUN: 13 mg/dL (ref 6–20)
CO2: 27 mmol/L (ref 22–32)
Calcium: 8.7 mg/dL — ABNORMAL LOW (ref 8.9–10.3)
Chloride: 105 mmol/L (ref 98–111)
Creatinine, Ser: 0.76 mg/dL (ref 0.44–1.00)
GFR calc Af Amer: >60 mL/min (ref >60)
GFR calc non Af Amer: >60 mL/min (ref >60)
Glucose, Bld: 150 mg/dL — ABNORMAL HIGH (ref 70–99)
Potassium: 4.2 mmol/L (ref 3.5–5.1)
Sodium: 140 mmol/L (ref 135–145)
Total Bilirubin: 0.4 mg/dL (ref 0.3–1.2)
Total Protein: 6.3 g/dL — ABNORMAL LOW (ref 6.5–8.1)

## 2020-05-04 LAB — PROCALCITONIN: Procalcitonin: <0.1 ng/mL

## 2020-05-04 LAB — CULTURE, BLOOD (ROUTINE X 2)
Culture: NO GROWTH
Culture: NO GROWTH
Special Requests: ADEQUATE

## 2020-05-04 LAB — MAGNESIUM: Magnesium: 2.4 mg/dL (ref 1.7–2.4)

## 2020-05-04 LAB — BRAIN NATRIURETIC PEPTIDE: B Natriuretic Peptide: 25.5 pg/mL (ref 0.0–100.0)

## 2020-05-04 LAB — C-REACTIVE PROTEIN: CRP: 0.8 mg/dL (ref <1.0)

## 2020-05-04 MED ORDER — METHYLPREDNISOLONE SODIUM SUCC 40 MG IJ SOLR
40.0000 mg | Freq: Every day | INTRAMUSCULAR | Status: DC
  Administered 2020-05-05: 40 mg via INTRAVENOUS
  Filled 2020-05-04: qty 1

## 2020-05-04 NOTE — Progress Notes (Signed)
SATURATION QUALIFICATIONS: (This note is used to comply with regulatory documentation for home oxygen)  Patient Saturations on Room Air at Rest = 96%  Patient Saturations on Room Air while Ambulating = 94% Pt ambulated down the hall 50 ft, no complaint of shortness of breath or discomfort.

## 2020-05-04 NOTE — Progress Notes (Signed)
PROGRESS NOTE                                                                                                                                                                                                             Patient Demographics:    Diane Shelton, is a 24 y.o. female, DOB - 12/06/1996, MNO:[id number]  Outpatient Primary MD for the patient is Patient, No Pcp Per    LOS - 5  Admit date - 04/29/2020    Chief Complaint  Patient presents with  . Covid Exposure  . Emesis  . Diarrhea  . Fever       Brief Narrative - Diane Shelton is a 24 y.o. female with medical history significant of obesity who presents with dyspnea.  Patient reports 7 days of not feeling well, consistent with malaise, generalized weakness, nausea, vomiting, diarrhea and poor appetite, she presented to [facility], ER where she was diagnosed with acute hypoxic respiratory failure due to COVID-19 pneumonia and admitted to the hospital.  At [facility] she received IV steroids, her first dose of remdesivir along with Actemra.   Subjective:   Patient in bed, appears comfortable, denies any headache, no fever, no chest pain or pressure, no shortness of breath , no abdominal pain. No focal weakness.   Assessment  & Plan :     1. Acute Hypoxic Resp. Failure due to Acute Covid 19 Viral Pneumonitis during the ongoing 2020 Covid 19 Pandemic - she has severe disease she was appropriately treated at [facility] with IV steroids ( taper) , remdesivir and Actemra x 2,  CTA chest negative, she is finally showing sustained improvement since 05/03/2020 and now down to 3 L from 8L.  Continue taper oxygen, will evaluate for home oxygen, if continues to improve likely discharge in the next 1 to 2 days.  Encouraged the patient to sit up in chair in the daytime use I-S and flutter valve for pulmonary toiletry and then prone in bed when at night.  Will advance  activity and titrate down oxygen as possible.   Recent Labs  Lab 04/30/20 0545 05/01/20 0349 05/02/20 0735 05/03/20 0809 05/04/20 0406  CRP 9.6* 4.4* 1.8* 0.9 0.8  DDIMER 1.06* 0.73* 0.47 0.38 0.45  BNP 41.7 46.5 62.4 50.5 25.5  PROCALCITON 0.23 <0.10 <0.10 <0.10 <0.10  Hepatic Function Latest Ref Rng & Units 05/04/2020 05/03/2020 05/02/2020  Total Protein 6.5 - 8.1 g/dL 6.3(L) 6.5 6.5  Albumin 3.5 - 5.0 g/dL 3.3(L) 3.4(L) 3.2(L)  AST 15 - 41 U/L 108(H) 116(H) 70(H)  ALT 0 - 44 U/L 230(H) 158(H) 119(H)  Alk Phosphatase 38 - 126 U/L 49 42 46  Total Bilirubin 0.3 - 1.2 mg/dL 0.4 0.4 0.6     2.  COVID-19 viral illness plus Remdesivir induced mild asymptomatic transaminitis.  Mains asymptomatic remdesivir finished on 05/04/2020 will trend and monitor total bilirubin stable.  3.  Viral illness induced gastroenteritis.  Now completely resolved with supportive care.   4.  Dehydration.  Resolved after IV fluids.    Condition - Extremely Guarded  Family Communication  :  None  Code Status :  Full  Consults  :  None  Disposition Plan  :    Status is: Inpatient  Remains inpatient appropriate because:IV treatments appropriate due to intensity of illness or inability to take PO   Dispo: The patient is from: Home              Anticipated d/c is to: Home              Anticipated d/c date is:  1-2 days              Patient currently is medically stable to d/c.  Has severe acute hypoxic respiratory failure due to COVID-19 pneumonia and is finishing treatment for that.  Procedures  :  None  CT angiogram - No PE  PUD Prophylaxis : None  DVT Prophylaxis  :  Lovenox   Lab Results  Component Value Date   PLT 306 05/04/2020    Diet :  Diet Order            Diet regular Room service appropriate? Yes; Fluid consistency: Thin  Diet effective now               Inpatient Medications  Scheduled Meds: . vitamin C  500 mg Oral Daily  . chlorpheniramine-HYDROcodone  5 mL  Oral Q12H  . citalopram  20 mg Oral Daily  . enoxaparin (LOVENOX) injection  55 mg Subcutaneous Q24H  . Ipratropium-Albuterol  1 puff Inhalation Q6H  . methylPREDNISolone (SOLU-MEDROL) injection  60 mg Intravenous Daily  . multivitamin with minerals  1 tablet Oral Daily  . zinc sulfate  220 mg Oral Daily   Continuous Infusions:  PRN Meds:.acetaminophen **OR** [DISCONTINUED] acetaminophen, albuterol, guaiFENesin-dextromethorphan, [DISCONTINUED] ondansetron **OR** ondansetron (ZOFRAN) IV  Antibiotics  :    Anti-infectives (From admission, onward)   Start     Dose/Rate Route Frequency Ordered Stop   04/30/20 1000  remdesivir 100 mg in sodium chloride 0.9 % 100 mL IVPB     100 mg 200 mL/hr over 30 Minutes Intravenous Daily 04/29/20 1911 05/03/20 1054   04/29/20 1915  remdesivir 200 mg in sodium chloride 0.9% 250 mL IVPB     200 mg 580 mL/hr over 30 Minutes Intravenous Once 04/29/20 1911 04/30/20 0424       Time Spent in minutes  30   Lala Lund M.D on 05/04/2020 at 10:38 AM  To page go to [email or web address] - password [facility]  [facility] -  Office  [phone]   See all Orders from today for further details    Objective:   Vitals:   05/03/20 1600 05/03/20 1813 05/03/20 2053 05/04/20 0433  BP:  112/69 116/61 (!) 95/49  Pulse: 62 90  62 (!) 57  Resp:   16 20  Temp:  97.7 F (36.5 C) 99.2 F (37.3 C) 97.6 F (36.4 C)  TempSrc:  Oral Oral Oral  SpO2: 97% 97% 95% 97%  Weight:      Height:        Wt Readings from Last 3 Encounters:  04/29/20 114 kg  10/22/18 98.4 kg  09/07/18 97.8 kg     Intake/Output Summary (Last 24 hours) at 05/04/2020 1038 Last data filed at 05/04/2020 0900 Gross per 24 hour  Intake 960 ml  Output --  Net 960 ml     Physical Exam  Awake Alert, No new F.N deficits, Normal affect [place].AT,PERRAL Supple Neck,No JVD, No cervical lymphadenopathy appriciated.  Symmetrical Chest wall movement, Good air movement bilaterally, CTAB RRR,No  Gallops, Rubs or new Murmurs, No Parasternal Heave +ve B.Sounds, Abd Soft, No tenderness, No organomegaly appriciated, No rebound - guarding or rigidity. No Cyanosis, Clubbing or edema, No new Rash or bruise    Data Review:    CBC Recent Labs  Lab 04/29/20 1644 04/29/20 1644 04/30/20 0545 05/01/20 0847 05/02/20 0735 05/03/20 0809 05/04/20 0406  WBC 4.9   < > 3.3* 4.7 5.1 6.6 7.9  HGB 15.4*   < > 14.4 13.9 14.0 14.3 14.2  HCT 48.8*   < > 45.5 44.3 43.2 45.0 44.7  PLT 170   < > 175 253 301 326 306  MCV 89.2   < > 91.0 90.4 90.0 90.2 89.6  MCH 28.2   < > 28.8 28.4 29.2 28.7 28.5  MCHC 31.6   < > 31.6 31.4 32.4 31.8 31.8  RDW 14.2   < > 14.4 14.4 14.2 14.0 13.8  LYMPHSABS 1.1  --   --  1.7 1.5 2.6 2.1  MONOABS 0.3  --   --  0.4 0.6 0.9 0.9  EOSABS 0.0  --   --  0.0 0.0 0.0 0.0  BASOSABS 0.0  --   --  0.0 0.0 0.0 0.1   < > = values in this interval not displayed.    Chemistries  Recent Labs  Lab 04/30/20 0545 05/01/20 0349 05/02/20 0735 05/03/20 0809 05/04/20 0406  NA 141 141 141 141 140  K 4.7 4.6 4.0 4.1 4.2  CL 110 110 105 103 105  CO2 19* 21* _0 GLUCOSE 120* 152* 160* 115* 150*  BUN _1 CREATININE 0.72 0.72 0.70 0.89 0.76  CALCIUM 8.5* 8.6* 8.6* 8.8* 8.7*  AST 149* 112* 70* 116* 108*  ALT 134* 126* 119* 158* 230*  ALKPHOS 44 45 46 42 49  BILITOT 0.6 0.7 0.6 0.4 0.4  MG 2.4 2.2 2.3 2.4 2.4     ------------------------------------------------------------------------------------------------------------------ No results for input(s): CHOL, HDL, LDLCALC, TRIG, CHOLHDL, LDLDIRECT in the last 72 hours.  No results found for: HGBA1C ------------------------------------------------------------------------------------------------------------------ No results for input(s): TSH, T4TOTAL, T3FREE, THYROIDAB in the last 72 hours.  Invalid input(s): FREET3  Cardiac Enzymes No results for input(s): CKMB, TROPONINI, MYOGLOBIN in the last 168  hours.  Invalid input(s): CK ------------------------------------------------------------------------------------------------------------------    Component Value Date/Time   BNP 25.5 05/04/2020 0406    Micro Results Recent Results (from the past 240 hour(s))  Blood Culture (routine x 2)     Status: None   Collection Time: 04/29/20  4:25 PM   Specimen: BLOOD RIGHT HAND  Result Value Ref Range Status   Specimen Description BLOOD RIGHT HAND  Final   Special Requests  Final    BOTTLES DRAWN AEROBIC AND ANAEROBIC Blood Culture results may not be optimal due to an inadequate volume of blood received in culture bottles   Culture   Final    NO GROWTH 5 DAYS Performed at [facility], [place] [address], [place], [place] [address]    Report Status 05/04/2020 FINAL  Final  Blood Culture (routine x 2)     Status: None   Collection Time: 04/29/20  5:09 PM   Specimen: BLOOD  Result Value Ref Range Status   Specimen Description BLOOD RIGHT ANTECUBITAL  Final   Special Requests   Final    BOTTLES DRAWN AEROBIC AND ANAEROBIC Blood Culture adequate volume   Culture   Final    NO GROWTH 5 DAYS Performed at [facility], [place] [address] [place], [place] [address]    Report Status 05/04/2020 FINAL  Final    Radiology Reports CT ANGIO CHEST PE W OR WO CONTRAST  Result Date: 05/01/2020 CLINICAL DATA:  Shortness of breath, malaise.  COVID positive. EXAM: CT ANGIOGRAPHY CHEST WITH CONTRAST TECHNIQUE: Multidetector CT imaging of the chest was performed using the standard protocol during bolus administration of intravenous contrast. Multiplanar CT image reconstructions and MIPs were obtained to evaluate the vascular anatomy. CONTRAST:  45m OMNIPAQUE IOHEXOL 350 MG/ML SOLN COMPARISON:  None. FINDINGS: Cardiovascular: Heart size upper limits normal. No significant pericardial effusion. Satisfactory opacification of pulmonary arteries noted, and there is no evidence of pulmonary emboli. Fair  contrast opacification of the thoracic aorta, which is nondilated. Mediastinum/Nodes: No hilar or mediastinal adenopathy. Lungs/Pleura: No significant pleural effusion. No pneumothorax. Extensive scattered patchy airspace opacities in primarily in the lung bases with consolidation posteriorly, and scattered ground-glass opacities throughout the lungs with a predominantly peripheral distribution. Upper Abdomen: No acute findings Musculoskeletal: No chest wall abnormality. No acute or significant osseous findings. Review of the MIP images confirms the above findings. IMPRESSION: 1. Negative for acute PE. 2. Extensive bilateral pulmonary airspace and ground-glass opacities as above, may represent infectious/inflammatory etiology versus atypical edema. Electronically Signed   By: DLucrezia EuropeM.D.   On: 05/01/2020 16:06   DG Chest Port 1 View  Result Date: 04/29/2020 CLINICAL DATA:  COVID-19 positive, shortness of breath, fevers and chills EXAM: PORTABLE CHEST 1 VIEW COMPARISON:  Radiograph 02/24/2014 FINDINGS: Lung volumes are markedly diminished with basilar atelectasis and superimposed areas of consolidative opacity predominantly along the periphery of the left lung and partially silhouetting the right hemidiaphragm and right heart border. No pneumothorax. Possible small right effusion. No sizable left effusion. Cardiomediastinal contours are partially obscured by opacity. No acute osseous or soft tissue abnormality. Telemetry leads overlie the chest. IMPRESSION: Diminished lung volumes with basilar atelectasis and superimposed areas of consolidative opacity compatible with a multifocal pneumonia in the setting of COVID-19 positivity. Suspect small right effusion as well, could be better determined with upright PA and lateral radiograph. Electronically Signed   By: PLovena LeM.D.   On: 04/29/2020 16:10

## 2020-05-05 LAB — CBC WITH DIFFERENTIAL/PLATELET
Abs Immature Granulocytes: 0 10*3/uL (ref 0.00–0.07)
Basophils Absolute: 0 10*3/uL (ref 0.0–0.1)
Basophils Relative: 0 %
Eosinophils Absolute: 0.1 10*3/uL (ref 0.0–0.5)
Eosinophils Relative: 1 %
HCT: 46.2 % — ABNORMAL HIGH (ref 36.0–46.0)
Hemoglobin: 14.6 g/dL (ref 12.0–15.0)
Lymphocytes Relative: 38 %
Lymphs Abs: 3.8 10*3/uL (ref 0.7–4.0)
MCH: 28.5 pg (ref 26.0–34.0)
MCHC: 31.6 g/dL (ref 30.0–36.0)
MCV: 90.2 fL (ref 80.0–100.0)
Monocytes Absolute: 0.3 10*3/uL (ref 0.1–1.0)
Monocytes Relative: 3 %
Neutro Abs: 5.8 10*3/uL (ref 1.7–7.7)
Neutrophils Relative %: 57 %
Platelets: 314 10*3/uL (ref 150–400)
RBC: 5.12 MIL/uL — ABNORMAL HIGH (ref 3.87–5.11)
RDW: 13.9 % (ref 11.5–15.5)
WBC: 10.1 10*3/uL (ref 4.0–10.5)
nRBC: 0.3 % — ABNORMAL HIGH (ref 0.0–0.2)
nRBC: 1 /100 WBC — ABNORMAL HIGH

## 2020-05-05 LAB — COMPREHENSIVE METABOLIC PANEL
ALT: 195 U/L — ABNORMAL HIGH (ref 0–44)
AST: 65 U/L — ABNORMAL HIGH (ref 15–41)
Albumin: 3.3 g/dL — ABNORMAL LOW (ref 3.5–5.0)
Alkaline Phosphatase: 49 U/L (ref 38–126)
Anion gap: 9 (ref 5–15)
BUN: 13 mg/dL (ref 6–20)
CO2: 27 mmol/L (ref 22–32)
Calcium: 8.8 mg/dL — ABNORMAL LOW (ref 8.9–10.3)
Chloride: 104 mmol/L (ref 98–111)
Creatinine, Ser: 0.82 mg/dL (ref 0.44–1.00)
GFR calc Af Amer: >60 mL/min (ref >60)
GFR calc non Af Amer: >60 mL/min (ref >60)
Glucose, Bld: 115 mg/dL — ABNORMAL HIGH (ref 70–99)
Potassium: 4.2 mmol/L (ref 3.5–5.1)
Sodium: 140 mmol/L (ref 135–145)
Total Bilirubin: 0.5 mg/dL (ref 0.3–1.2)
Total Protein: 6.3 g/dL — ABNORMAL LOW (ref 6.5–8.1)

## 2020-05-05 LAB — C-REACTIVE PROTEIN: CRP: 0.6 mg/dL (ref <1.0)

## 2020-05-05 LAB — D-DIMER, QUANTITATIVE: D-Dimer, Quant: 0.49 ug/mL-FEU (ref 0.00–0.50)

## 2020-05-05 LAB — BRAIN NATRIURETIC PEPTIDE: B Natriuretic Peptide: 26.2 pg/mL (ref 0.0–100.0)

## 2020-05-05 LAB — MAGNESIUM: Magnesium: 2.3 mg/dL (ref 1.7–2.4)

## 2020-05-05 MED ORDER — ALBUTEROL SULFATE HFA 108 (90 BASE) MCG/ACT IN AERS: 2.0000 | INHALATION_SPRAY | Freq: Four times a day (QID) | RESPIRATORY_TRACT | 0 refills | Status: DC | PRN

## 2020-05-05 MED ORDER — METHYLPREDNISOLONE 4 MG PO TBPK
ORAL_TABLET | ORAL | 0 refills | Status: DC
Start: 2020-05-05 — End: 2023-03-27

## 2020-05-05 NOTE — Progress Notes (Signed)
Pt slept for most of the night.  Saturating well on room air. No new complaints.  No acute events overnight.

## 2020-05-05 NOTE — Discharge Summary (Signed)
Diane Shelton TSV:[id number] DOB: December 22, 1995 DOA: 04/29/2020  PCP: Patient, No Pcp Per  Admit date: 04/29/2020  Discharge date: 05/05/2020  Admitted From: Home   Disposition:  Home   Recommendations for Outpatient Follow-up:   Follow up with PCP in 1-2 weeks  PCP Please obtain BMP/CBC, 2 view CXR in 1week,  (see Discharge instructions)   PCP Please follow up on the following pending results:    Home Health: None   Equipment/Devices: None  Consultations: None  Discharge Condition: Stable    CODE STATUS: Full    Diet Recommendation: Heart Healthy   Diet Order            Diet - low sodium heart healthy        Diet regular Room service appropriate? Yes; Fluid consistency: Thin  Diet effective now               Chief Complaint  Patient presents with  . Covid Exposure  . Emesis  . Diarrhea  . Fever     Brief history of present illness from the day of admission and additional interim summary    Diane Shelton a 24 y.o.femalewith medical history significant ofobesitywho presents with dyspnea. Patient reports 7 days of not feeling well, consistent with malaise, generalized weakness, nausea, vomiting,diarrhea and poor appetite, she presented to [facility], ER where she was diagnosed with acute hypoxic respiratory failure due to COVID-19 pneumonia and admitted to the hospital.  At [facility] she received IV steroids, her first dose of remdesivir along with Actemra.                                                                 Hospital Course   1. Acute Hypoxic Resp. Failure due to Acute Covid 19 Viral Pneumonitis during the ongoing 2020 Covid 19 Pandemic - she had severe disease and was treated with IV steroids, remdesivir along with 2 doses of Actemra, she was quite tenuous for the first 3 to 4  days but now has gradually improved and stable on room air, will be discharged home on oral steroid taper along with albuterol rescue inhaler with PCP follow-up.   Recent Labs  Lab 04/29/20 1644 04/29/20 1644 04/30/20 0545 04/30/20 0545 05/01/20 0349 05/02/20 0735 05/03/20 0809 05/04/20 0406 05/05/20 0402  CRP 11.7*   < > 9.6*   < > 4.4* 1.8* 0.9 0.8 0.6  DDIMER 1.42*   < > 1.06*   < > 0.73* 0.47 0.38 0.45 0.49  FERRITIN 554*  --  488*  --   --   --   --   --   --   BNP  --   --  41.7   < > 46.5 62.4 50.5 25.5 26.2  PROCALCITON 0.32   < > 0.23  --  <  0.10 <0.10 <0.10 <0.10  --    < > = values in this interval not displayed.    Hepatic Function Latest Ref Rng & Units 05/05/2020 05/04/2020 05/03/2020  Total Protein 6.5 - 8.1 g/dL 6.3(L) 6.3(L) 6.5  Albumin 3.5 - 5.0 g/dL 3.3(L) 3.3(L) 3.4(L)  AST 15 - 41 U/L 65(H) 108(H) 116(H)  ALT 0 - 44 U/L 195(H) 230(H) 158(H)  Alk Phosphatase 38 - 126 U/L 49 49 42  Total Bilirubin 0.3 - 1.2 mg/dL 0.5 0.4 0.4    2.  COVID-19 viral illness plus Remdesivir induced mild asymptomatic transaminitis, trending started to improve remains symptom-free PCP to repeat CMP in 7 to 10 days.  3.  Viral illness induced gastroenteritis.  Now completely resolved with supportive care.   4.  Dehydration.  Resolved after IV fluids.    Discharge diagnosis     Principal Problem:   Pneumonia due to COVID-19 virus Active Problems:   Obesity   Hypokalemia    Discharge instructions    Discharge Instructions    Diet - low sodium heart healthy   Complete by: As directed    Discharge instructions   Complete by: As directed    Follow with Primary MD  in 7 days   Get CBC, CMP, 2 view Chest X ray -  checked next visit within 1 week by Primary MD   Activity: As tolerated with Full fall precautions use walker/cane & assistance as needed  Disposition Home   Diet: Heart Healthy    Special Instructions: If you have smoked or chewed Tobacco  in the last  2 yrs please stop smoking, stop any regular Alcohol  and or any Recreational drug use.  On your next visit with your primary care physician please Get Medicines reviewed and adjusted.  Please request your Prim.MD to go over all Hospital Tests and Procedure/Radiological results at the follow up, please get all Hospital records sent to your Prim MD by signing hospital release before you go home.  If you experience worsening of your admission symptoms, develop shortness of breath, life threatening emergency, suicidal or homicidal thoughts you must seek medical attention immediately by calling 911 or calling your MD immediately  if symptoms less severe.  You Must read complete instructions/literature along with all the possible adverse reactions/side effects for all the Medicines you take and that have been prescribed to you. Take any new Medicines after you have completely understood and accpet all the possible adverse reactions/side effects.   Increase activity slowly   Complete by: As directed    MyChart COVID-19 home monitoring program   Complete by: May 05, 2020    Is the patient willing to use the [place] for home monitoring?: Yes   Temperature monitoring   Complete by: May 05, 2020    After how many days would you like to receive a notification of this patient's flowsheet entries?: 1      Discharge Medications   Allergies as of 05/05/2020   No Known Allergies     Medication List    STOP taking these medications   naproxen 500 MG tablet Commonly known as: NAPROSYN     TAKE these medications   acetaminophen 500 MG tablet Commonly known as: TYLENOL Take 500-1,000 mg by mouth every 6 (six) hours as needed (for fevers or body aches).   albuterol 108 (90 Base) MCG/ACT inhaler Commonly known as: VENTOLIN HFA Inhale 2 puffs into the lungs every 6 (six) hours as needed for  wheezing or shortness of breath.   citalopram 20 MG tablet Commonly known as: CELEXA Take 20 mg  by mouth daily.   megestrol 40 MG tablet Commonly known as: MEGACE TAKE 1 TABLET BY MOUTH TWICE A DAY   methylPREDNISolone 4 MG Tbpk tablet Commonly known as: MEDROL DOSEPAK follow package directions   promethazine 25 MG tablet Commonly known as: PHENERGAN Take 1 tablet (25 mg total) by mouth every 6 (six) hours as needed for nausea or vomiting.       Follow-up Information    [place]. Schedule an appointment as soon as possible for a visit in 1 week(s).   Contact information: [address] [place] [address] [phone]          Major procedures and Radiology Reports - PLEASE review detailed and final reports thoroughly  -      CT ANGIO CHEST PE W OR WO CONTRAST  Result Date: 05/01/2020 CLINICAL DATA:  Shortness of breath, malaise.  COVID positive. EXAM: CT ANGIOGRAPHY CHEST WITH CONTRAST TECHNIQUE: Multidetector CT imaging of the chest was performed using the standard protocol during bolus administration of intravenous contrast. Multiplanar CT image reconstructions and MIPs were obtained to evaluate the vascular anatomy. CONTRAST:  2m OMNIPAQUE IOHEXOL 350 MG/ML SOLN COMPARISON:  None. FINDINGS: Cardiovascular: Heart size upper limits normal. No significant pericardial effusion. Satisfactory opacification of pulmonary arteries noted, and there is no evidence of pulmonary emboli. Fair contrast opacification of the thoracic aorta, which is nondilated. Mediastinum/Nodes: No hilar or mediastinal adenopathy. Lungs/Pleura: No significant pleural effusion. No pneumothorax. Extensive scattered patchy airspace opacities in primarily in the lung bases with consolidation posteriorly, and scattered ground-glass opacities throughout the lungs with a predominantly peripheral distribution. Upper Abdomen: No acute findings Musculoskeletal: No chest wall abnormality. No acute or significant osseous findings. Review of the MIP images  confirms the above findings. IMPRESSION: 1. Negative for acute PE. 2. Extensive bilateral pulmonary airspace and ground-glass opacities as above, may represent infectious/inflammatory etiology versus atypical edema. Electronically Signed   By: DLucrezia EuropeM.D.   On: 05/01/2020 16:06   DG Chest Port 1 View  Result Date: 04/29/2020 CLINICAL DATA:  COVID-19 positive, shortness of breath, fevers and chills EXAM: PORTABLE CHEST 1 VIEW COMPARISON:  Radiograph 02/24/2014 FINDINGS: Lung volumes are markedly diminished with basilar atelectasis and superimposed areas of consolidative opacity predominantly along the periphery of the left lung and partially silhouetting the right hemidiaphragm and right heart border. No pneumothorax. Possible small right effusion. No sizable left effusion. Cardiomediastinal contours are partially obscured by opacity. No acute osseous or soft tissue abnormality. Telemetry leads overlie the chest. IMPRESSION: Diminished lung volumes with basilar atelectasis and superimposed areas of consolidative opacity compatible with a multifocal pneumonia in the setting of COVID-19 positivity. Suspect small right effusion as well, could be better determined with upright PA and lateral radiograph. Electronically Signed   By: PLovena LeM.D.   On: 04/29/2020 16:10    Micro Results    Recent Results (from the past 240 hour(s))  Blood Culture (routine x 2)     Status: None   Collection Time: 04/29/20  4:25 PM   Specimen: BLOOD RIGHT HAND  Result Value Ref Range Status   Specimen Description BLOOD RIGHT HAND  Final   Special Requests   Final    BOTTLES DRAWN AEROBIC AND ANAEROBIC Blood Culture results may not be optimal due to an inadequate volume of blood received in culture bottles  Culture   Final    NO GROWTH 5 DAYS Performed at [facility], [place] [address], [place], [place] [address]    Report Status 05/04/2020 FINAL  Final  Blood Culture (routine x 2)     Status: None    Collection Time: 04/29/20  5:09 PM   Specimen: BLOOD  Result Value Ref Range Status   Specimen Description BLOOD RIGHT ANTECUBITAL  Final   Special Requests   Final    BOTTLES DRAWN AEROBIC AND ANAEROBIC Blood Culture adequate volume   Culture   Final    NO GROWTH 5 DAYS Performed at [facility], [place] [address] [place], [place] [address]    Report Status 05/04/2020 FINAL  Final    Today   Subjective    Diane Shelton today has no headache,no chest abdominal pain,no new weakness tingling or numbness, feels much better wants to go home today.    Objective   Blood pressure 104/68, pulse (!) 59, temperature 97.8 F (36.6 C), temperature source Oral, resp. rate 20, height 5' 9"  (1.753 m), weight 114 kg, SpO2 96 %.   Intake/Output Summary (Last 24 hours) at 05/05/2020 0925 Last data filed at 05/05/2020 0557 Gross per 24 hour  Intake 423 ml  Output --  Net 423 ml    Exam  Awake Alert, No new F.N deficits, Normal affect [place].AT,PERRAL Supple Neck,No JVD, No cervical lymphadenopathy appriciated.  Symmetrical Chest wall movement, Good air movement bilaterally, CTAB RRR,No Gallops,Rubs or new Murmurs, No Parasternal Heave +ve B.Sounds, Abd Soft, Non tender, No organomegaly appriciated, No rebound -guarding or rigidity. No Cyanosis, Clubbing or edema, No new Rash or bruise   Data Review   CBC w Diff:  Lab Results  Component Value Date   WBC 10.1 05/05/2020   HGB 14.6 05/05/2020   HCT 46.2 (H) 05/05/2020   PLT 314 05/05/2020   LYMPHOPCT 38 05/05/2020   MONOPCT 3 05/05/2020   EOSPCT 1 05/05/2020   BASOPCT 0 05/05/2020    CMP:  Lab Results  Component Value Date   NA 140 05/05/2020   K 4.2 05/05/2020   CL 104 05/05/2020   CO2 27 05/05/2020   BUN 13 05/05/2020   CREATININE 0.82 05/05/2020   PROT 6.3 (L) 05/05/2020   ALBUMIN 3.3 (L) 05/05/2020   BILITOT 0.5 05/05/2020   ALKPHOS 49 05/05/2020   AST 65 (H) 05/05/2020   ALT 195 (H) 05/05/2020  .   Total  Time in preparing paper work, data evaluation and todays exam - 63 minutes  Lala Lund M.D on 05/05/2020 at 9:25 AM  [facility]   Office  [phone]

## 2020-05-05 NOTE — Discharge Instructions (Signed)
Follow with Primary MD  in 7 days   Get CBC, CMP, 2 view Chest X ray -  checked next visit within 1 week by Primary MD   Activity: As tolerated with Full fall precautions use walker/cane & assistance as needed  Disposition Home   Diet: Heart Healthy    Special Instructions: If you have smoked or chewed Tobacco  in the last 2 yrs please stop smoking, stop any regular Alcohol  and or any Recreational drug use.  On your next visit with your primary care physician please Get Medicines reviewed and adjusted.  Please request your Prim.MD to go over all Hospital Tests and Procedure/Radiological results at the follow up, please get all Hospital records sent to your Prim MD by signing hospital release before you go home.  If you experience worsening of your admission symptoms, develop shortness of breath, life threatening emergency, suicidal or homicidal thoughts you must seek medical attention immediately by calling 911 or calling your MD immediately  if symptoms less severe.  You Must read complete instructions/literature along with all the possible adverse reactions/side effects for all the Medicines you take and that have been prescribed to you. Take any new Medicines after you have completely understood and accpet all the possible adverse reactions/side effects.           Person Under Monitoring Name: Diane Shelton  Location: [address] [place] [address]   Infection Prevention Recommendations for Individuals Confirmed to have, or Being Evaluated for, 2019 Novel Coronavirus (COVID-19) Infection Who Receive Care at Home  Individuals who are confirmed to have, or are being evaluated for, COVID-19 should follow the prevention steps below until a healthcare provider or local or state health department says they can return to normal activities.  Stay home except to get medical care You should restrict activities outside your home, except for getting medical care. Do  not go to work, school, or public areas, and do not use public transportation or taxis.  Call ahead before visiting your doctor Before your medical appointment, call the healthcare provider and tell them that you have, or are being evaluated for, COVID-19 infection. This will help the healthcare provider's office take steps to keep other people from getting infected. Ask your healthcare provider to call the local or state health department.  Monitor your symptoms Seek prompt medical attention if your illness is worsening (e.g., difficulty breathing). Before going to your medical appointment, call the healthcare provider and tell them that you have, or are being evaluated for, COVID-19 infection. Ask your healthcare provider to call the local or state health department.  Wear a facemask You should wear a facemask that covers your nose and mouth when you are in the same room with other people and when you visit a healthcare provider. People who live with or visit you should also wear a facemask while they are in the same room with you.  Separate yourself from other people in your home As much as possible, you should stay in a different room from other people in your home. Also, you should use a separate bathroom, if available.  Avoid sharing household items You should not share dishes, drinking glasses, cups, eating utensils, towels, bedding, or other items with other people in your home. After using these items, you should wash them thoroughly with soap and water.  Cover your coughs and sneezes Cover your mouth and nose with a tissue when you cough or sneeze, or you can cough or  sneeze into your sleeve. Throw used tissues in a lined trash can, and immediately wash your hands with soap and water for at least 20 seconds or use an alcohol-based hand rub.  Wash your [facility] [employer] your hands often and thoroughly with soap and water for at least 20 seconds. You can use an alcohol-based  hand sanitizer if soap and water are not available and if your hands are not visibly dirty. Avoid touching your eyes, nose, and mouth with unwashed hands.   Prevention Steps for Caregivers and Household Members of Individuals Confirmed to have, or Being Evaluated for, COVID-19 Infection Being Cared for in the Home  If you live with, or provide care at home for, a person confirmed to have, or being evaluated for, COVID-19 infection please follow these guidelines to prevent infection:  Follow healthcare provider's instructions Make sure that you understand and can help the patient follow any healthcare provider instructions for all care.  Provide for the patient's basic needs You should help the patient with basic needs in the home and provide support for getting groceries, prescriptions, and other personal needs.  Monitor the patient's symptoms If they are getting sicker, call his or her medical provider and tell them that the patient has, or is being evaluated for, COVID-19 infection. This will help the healthcare provider's office take steps to keep other people from getting infected. Ask the healthcare provider to call the local or state health department.  Limit the number of people who have contact with the patient  If possible, have only one caregiver for the patient.  Other household members should stay in another home or place of residence. If this is not possible, they should stay  in another room, or be separated from the patient as much as possible. Use a separate bathroom, if available.  Restrict visitors who do not have an essential need to be in the home.  Keep older adults, very young children, and other sick people away from the patient Keep older adults, very young children, and those who have compromised immune systems or chronic health conditions away from the patient. This includes people with chronic heart, lung, or kidney conditions, diabetes, and  cancer.  Ensure good ventilation Make sure that shared spaces in the home have good air flow, such as from an air conditioner or an opened window, weather permitting.  Wash your hands often  Wash your hands often and thoroughly with soap and water for at least 20 seconds. You can use an alcohol based hand sanitizer if soap and water are not available and if your hands are not visibly dirty.  Avoid touching your eyes, nose, and mouth with unwashed hands.  Use disposable paper towels to dry your hands. If not available, use dedicated cloth towels and replace them when they become wet.  Wear a facemask and gloves  Wear a disposable facemask at all times in the room and gloves when you touch or have contact with the patient's blood, body fluids, and/or secretions or excretions, such as sweat, saliva, sputum, nasal mucus, vomit, urine, or feces.  Ensure the mask fits over your nose and mouth tightly, and do not touch it during use.  Throw out disposable facemasks and gloves after using them. Do not reuse.  Wash your hands immediately after removing your facemask and gloves.  If your personal clothing becomes contaminated, carefully remove clothing and launder. Wash your hands after handling contaminated clothing.  Place all used disposable facemasks, gloves, and other waste  in a lined container before disposing them with other household waste.  Remove gloves and wash your hands immediately after handling these items.  Do not share dishes, glasses, or other household items with the patient  Avoid sharing household items. You should not share dishes, drinking glasses, cups, eating utensils, towels, bedding, or other items with a patient who is confirmed to have, or being evaluated for, COVID-19 infection.  After the person uses these items, you should wash them thoroughly with soap and water.  Wash laundry thoroughly  Immediately remove and wash clothes or bedding that have blood, body  fluids, and/or secretions or excretions, such as sweat, saliva, sputum, nasal mucus, vomit, urine, or feces, on them.  Wear gloves when handling laundry from the patient.  Read and follow directions on labels of laundry or clothing items and detergent. In general, wash and dry with the warmest temperatures recommended on the label.  Clean all areas the individual has used often  Clean all touchable surfaces, such as counters, tabletops, doorknobs, bathroom fixtures, toilets, phones, keyboards, tablets, and bedside tables, every day. Also, clean any surfaces that may have blood, body fluids, and/or secretions or excretions on them.  Wear gloves when cleaning surfaces the patient has come in contact with.  Use a diluted bleach solution (e.g., dilute bleach with 1 part bleach and 10 parts water) or a household disinfectant with a label that says EPA-registered for coronaviruses. To make a bleach solution at home, add 1 tablespoon of bleach to 1 quart (4 cups) of water. For a larger supply, add  cup of bleach to 1 gallon (16 cups) of water.  Read labels of cleaning products and follow recommendations provided on product labels. Labels contain instructions for safe and effective use of the cleaning product including precautions you should take when applying the product, such as wearing gloves or eye protection and making sure you have good ventilation during use of the product.  Remove gloves and wash hands immediately after cleaning.  Monitor yourself for signs and symptoms of illness Caregivers and household members are considered close contacts, should monitor their health, and will be asked to limit movement outside of the home to the extent possible. Follow the monitoring steps for close contacts listed on the symptom monitoring form.   ? If you have additional questions, contact your local health department or call the epidemiologist on call at [phone] (available 24/7). ? This  guidance is subject to change. For the most up-to-date guidance from [facility], please refer to their website: [email or web address]

## 2020-05-05 NOTE — Progress Notes (Signed)
Diane Shelton to be D/C'd Home per MD order.  Discussed with the patient and all questions fully answered.  VSS, Skin clean, dry and intact without evidence of skin break down, no evidence of skin tears noted. IV catheter discontinued intact. Site without signs and symptoms of complications. Dressing and pressure applied.  An After Visit Summary was printed and given to the patient. Patient received prescription.  D/c education completed with patient/family including follow up instructions, medication list, d/c activities limitations if indicated, with other d/c instructions as indicated by MD - patient able to verbalize understanding, all questions fully answered.   Patient instructed to return to ED, call 911, or call MD for any changes in condition.   Patient escorted via WC, and D/C home via private auto.   Conley Canal [facility] 05/05/2020 11:38 AM

## 2020-05-05 NOTE — Care Management (Signed)
Pt deemed stable for discharge home today.  Pt confirms she is independent from home.  Pt confirms she has a PCP and denied barriers with paying for discharge meds  Discharge orders written.  NO TOC consults/concerns found.  CM will await to sign off on pt once medication reconciliation is completed

## 2020-12-04 ENCOUNTER — Ambulatory Visit (INDEPENDENT_AMBULATORY_CARE_PROVIDER_SITE_OTHER): Payer: PRIVATE HEALTH INSURANCE | Admitting: Podiatry

## 2020-12-04 ENCOUNTER — Encounter: Payer: Self-pay | Admitting: Podiatry

## 2020-12-04 ENCOUNTER — Other Ambulatory Visit: Payer: Self-pay

## 2020-12-04 DIAGNOSIS — L600 Ingrowing nail: Secondary | ICD-10-CM

## 2020-12-04 NOTE — Progress Notes (Signed)
Subjective:  Patient ID: Diane Shelton, female    DOB: October 27, 1996,  MRN: [id number]  Chief Complaint  Patient presents with  . Ingrown Toenail    Left foot great toe, red swollen and drainage onset x18months ago     24 y.o. female presents with the above complaint.  Patient presents with left hallux lateral border ingrown nail.  Patient states is swollen drainage associated with it.  Patient still going on for 6 months.  She has tried some self debridement which has not helped.  She would like to have removed.  She denies any other acute complaints she has not done anything else for it.  She has not seen anyone else prior to seeing me.  Pain scale 7 out of 10.   Review of Systems: Negative except as noted in the HPI. Denies N/V/F/Ch.  Past Medical History:  Diagnosis Date  . COVID-19   . Medical history non-contributory   . Menorrhagia 02/27/2014  . Migraines    with aura  . Other and unspecified ovarian cyst 03/07/2014  . Pelvic pain in female 01/29/2015  . Physiological ovarian cysts   . RLQ abdominal pain 02/27/2014  . Unspecified symptom associated with female genital organs 08/26/2014  . Weight gain 01/29/2015    Current Outpatient Medications:  .  acetaminophen (TYLENOL) 500 MG tablet, Take 500-1,000 mg by mouth every 6 (six) hours as needed (for fevers or body aches)., Disp: , Rfl:  .  albuterol (VENTOLIN HFA) 108 (90 Base) MCG/ACT inhaler, Inhale 2 puffs into the lungs every 6 (six) hours as needed for wheezing or shortness of breath., Disp: 6.7 g, Rfl: 0 .  citalopram (CELEXA) 20 MG tablet, Take 20 mg by mouth daily., Disp: , Rfl:  .  megestrol (MEGACE) 40 MG tablet, TAKE 1 TABLET BY MOUTH TWICE A DAY (Patient taking differently: Take 40 mg by mouth 2 (two) times daily. ), Disp: 180 tablet, Rfl: 0 .  methylPREDNISolone (MEDROL DOSEPAK) 4 MG TBPK tablet, follow package directions, Disp: 21 tablet, Rfl: 0 .  promethazine (PHENERGAN) 25 MG tablet, Take 1 tablet (25 mg total) by  mouth every 6 (six) hours as needed for nausea or vomiting. (Patient not taking: Reported on 04/29/2020), Disp: 30 tablet, Rfl: 1  Social History   Tobacco Use  Smoking Status Never Smoker  Smokeless Tobacco Never Used  Tobacco Comment   smokes when drinks    No Known Allergies Objective:  There were no vitals filed for this visit. There is no height or weight on file to calculate BMI. Constitutional Well developed. Well nourished.  Vascular Dorsalis pedis pulses palpable bilaterally. Posterior tibial pulses palpable bilaterally. Capillary refill normal to all digits.  No cyanosis or clubbing noted. Pedal hair growth normal.  Neurologic Normal speech. Oriented to person, place, and time. Epicritic sensation to light touch grossly present bilaterally.  Dermatologic Painful ingrowing nail at lateral nail borders of the hallux nail left. No other open wounds. No skin lesions.  Orthopedic: Normal joint ROM without pain or crepitus bilaterally. No visible deformities. No bony tenderness.   Radiographs: None Assessment:   1. Ingrown left big toenail    Plan:  Patient was evaluated and treated and all questions answered.  Ingrown Nail, left -Patient elects to proceed with minor surgery to remove ingrown toenail removal today. Consent reviewed and signed by patient. -Ingrown nail excised. See procedure note. -Educated on post-procedure care including soaking. Written instructions provided and reviewed. -Patient to follow up in 2 weeks for  nail check.  Procedure: Excision of Ingrown Toenail Location: Left 1st toe lateral nail borders. Anesthesia: Lidocaine 1% plain; 1.5 mL and Marcaine 0.5% plain; 1.5 mL, digital block. Skin Prep: Betadine. Dressing: Silvadene; telfa; dry, sterile, compression dressing. Technique: Following skin prep, the toe was exsanguinated and a tourniquet was secured at the base of the toe. The affected nail border was freed, split with a nail splitter,  and excised. Chemical matrixectomy was then performed with phenol and irrigated out with alcohol. The tourniquet was then removed and sterile dressing applied. Disposition: Patient tolerated procedure well. Patient to return in 2 weeks for follow-up.   No follow-ups on file.

## 2020-12-10 ENCOUNTER — Other Ambulatory Visit: Payer: Self-pay

## 2020-12-11 ENCOUNTER — Other Ambulatory Visit: Payer: Self-pay

## 2020-12-15 MED ORDER — MEGESTROL ACETATE 40 MG PO TABS: 40.0000 mg | ORAL_TABLET | Freq: Two times a day (BID) | ORAL | 0 refills | Status: DC

## 2020-12-28 ENCOUNTER — Telehealth: Payer: BC Managed Care – PPO | Admitting: Family

## 2020-12-28 DIAGNOSIS — Z20822 Contact with and (suspected) exposure to covid-19: Secondary | ICD-10-CM

## 2020-12-28 DIAGNOSIS — R059 Cough, unspecified: Secondary | ICD-10-CM

## 2020-12-28 MED ORDER — DEXAMETHASONE 6 MG PO TABS: 6.0000 mg | ORAL_TABLET | Freq: Two times a day (BID) | ORAL | 0 refills | Status: DC

## 2020-12-28 MED ORDER — BENZONATATE 100 MG PO CAPS: 100.0000 mg | ORAL_CAPSULE | Freq: Three times a day (TID) | ORAL | 0 refills | Status: DC | PRN

## 2020-12-28 MED ORDER — FLUTICASONE PROPIONATE 50 MCG/ACT NA SUSP: 2.0000 | Freq: Every day | NASAL | 6 refills | Status: DC

## 2020-12-28 MED ORDER — ALBUTEROL SULFATE HFA 108 (90 BASE) MCG/ACT IN AERS: 2.0000 | INHALATION_SPRAY | Freq: Four times a day (QID) | RESPIRATORY_TRACT | 0 refills | Status: DC | PRN

## 2020-12-28 NOTE — Progress Notes (Signed)
E-Visit for Corona Virus Screening  Your current symptoms could be consistent with the coronavirus.  Many health care providers can now test patients at their office but not all are.  [place] has multiple testing sites. For information on our COVID testing locations and hours go to [email or web address]  We are enrolling you in our MyChart Home Monitoring for COVID19 . Daily you will receive a questionnaire within the MyChart website. Our COVID 19 response team will be monitoring your responses daily.  Testing Information: The COVID-19 Community Testing sites are testing BY APPOINTMENT ONLY.  You can schedule online at [email or web address]  If you do not have access to a smart phone or computer you may call [phone] for an appointment.   Additional testing sites in the Community:  . For [facility] Testing sites in [facility]  FarmerBuys.[email or web address]  . For Pop-up testing sites in [place]  [email or web address]  . For [facility] [email or web address].dk  . For [facility] testing in [place] and [employer] [email or web address]  . For [facility] testing in [facility]   [email or web address]  For  more information about community testing call [phone]   Please quarantine yourself while awaiting your test results. Please stay home for a minimum of 10 days from the first day of illness with improving symptoms and you have had 24 hours of no fever (without the use of Tylenol (Acetaminophen) Motrin (Ibuprofen) or any fever reducing medication).  Also - Do not get tested prior to returning to work because once you have had a positive test the test can stay  positive for more than a month in some cases.   You should wear a mask or cloth face covering over your nose and mouth if you must be around other people or animals, including pets (even at home). Try to stay at least 6 feet away from other people. This will protect the people around you.  Please continue good preventive care measures, including:  frequent hand-washing, avoid touching your face, cover coughs/sneezes, stay out of crowds and keep a 6 foot distance from others.  COVID-19 is a respiratory illness with symptoms that are similar to the flu. Symptoms are typically mild to moderate, but there have been cases of severe illness and death due to the virus.   The following symptoms may appear 2-14 days after exposure: . Fever . Cough . Shortness of breath or difficulty breathing . Chills . Repeated shaking with chills . Muscle pain . Headache . Sore throat . New loss of taste or smell . Fatigue . Congestion or runny nose . Nausea or vomiting . Diarrhea  Go to the nearest hospital ED for assessment if fever/cough/breathlessness are severe or illness seems like a threat to life.  It is vitally important that if you feel that you have an infection such as this virus or any other virus that you stay home and away from places where you may spread it to others.  You should avoid contact with people age 67 and older.   You can use medication such as A prescription cough medication called Tessalon Perles 100 mg. You may take 1-2 capsules every 8 hours as needed for cough and A prescription inhaler called Albuterol MDI 90 mcg /actuation 2 puffs every 4 hours as needed for shortness of breath, wheezing, cough and dexamethasone 6 mg twice a day for 7 days..   You may also take acetaminophen (Tylenol) as needed for fever.  Reduce your risk of any infection by using  the same precautions used for avoiding the common cold or flu:  Marland Kitchen Wash your hands often with soap and warm water for at least 20  seconds.  If soap and water are not readily available, use an alcohol-based hand sanitizer with at least 60% alcohol.  . If coughing or sneezing, cover your mouth and nose by coughing or sneezing into the elbow areas of your shirt or coat, into a tissue or into your sleeve (not your hands). . Avoid shaking hands with others and consider head nods or verbal greetings only. . Avoid touching your eyes, nose, or mouth with unwashed hands.  . Avoid close contact with people who are sick. . Avoid places or events with large numbers of people in one location, like concerts or sporting events. . Carefully consider travel plans you have or are making. . If you are planning any travel outside or inside the [place], visit the CDC's Travelers' Health webpage for the latest health notices. . If you have some symptoms but not all symptoms, continue to monitor at home and seek medical attention if your symptoms worsen. . If you are having a medical emergency, call 911.  HOME CARE . Only take medications as instructed by your medical team. . Drink plenty of fluids and get plenty of rest. . A steam or ultrasonic humidifier can help if you have congestion.   GET HELP RIGHT AWAY IF YOU HAVE EMERGENCY WARNING SIGNS** FOR COVID-19. If you or someone is showing any of these signs seek emergency medical care immediately. Call 911 or proceed to your closest emergency facility if: . You develop worsening high fever. . Trouble breathing . Bluish lips or face . Persistent pain or pressure in the chest . New confusion . Inability to wake or stay awake . You cough up blood. . Your symptoms become more severe  **This list is not all possible symptoms. Contact your medical provider for any symptoms that are sever or concerning to you.  MAKE SURE YOU   Understand these instructions.  Will watch your condition.  Will get help right away if you are not doing well or get worse.  Your e-visit answers were reviewed by a  board certified advanced clinical practitioner to complete your personal care plan.  Depending on the condition, your plan could have included both over the counter or prescription medications.  If there is a problem please reply once you have received a response from your provider.  Your safety is important to [place].  If you have drug allergies check your prescription carefully.    You can use MyChart to ask questions about today's visit, request a non-urgent call back, or ask for a work or school excuse for 24 hours related to this e-Visit. If it has been greater than 24 hours you will need to follow up with your provider, or enter a new e-Visit to address those concerns. You will get an e-mail in the next two days asking about your experience.  I hope that your e-visit has been valuable and will speed your recovery. Thank you for using e-visits.   Approximately 5 minutes was spent documenting and reviewing patient's chart.

## 2021-03-09 ENCOUNTER — Telehealth: Payer: Self-pay | Admitting: Adult Health

## 2021-03-09 NOTE — Telephone Encounter (Signed)
Pt has pain in pelvic area,like when had ovary cyst, she is still taking megace, passed a clot yesterday, so try Advil and tylenol together and push fluids and call me back tomorrow in Follow up, may need [place] to assess

## 2021-03-09 NOTE — Telephone Encounter (Signed)
Called pt for clarification of symptoms. Pt stated that she is having cyst pain and unsure of what else she can do. Going to the ER is not an option as her insurance is not adequate. She reports taking two 200 mg tablets of Advil and four 250 mg tablets of Tylenol. She is taking hot baths and using a heating pad. Instructed pt to take as much as 800 mg of Advil at a time and she can take both Advil and Tylenol together, as long as she doesn't exceed 3000 mg in a 24 hr period. Will talk to Cyril Mourning for further advice and follow-up with pt.

## 2021-03-09 NOTE — Telephone Encounter (Signed)
PT says she has been treated here in the past for ovarian cysts and currently has an ovarian cyst that will not rupture and is very painful. PT would like to know if there is any other medicine that can be prescribed for her because she has been taking tylenol, ibuprofen, and naproxen and none are not working. Would like for a nurse to call her back.

## 2021-03-16 ENCOUNTER — Other Ambulatory Visit: Payer: Self-pay | Admitting: Adult Health

## 2021-12-17 IMAGING — DX DG CHEST 1V PORT
1 series · 1 of 1 positions shown · non-contrast
Comparison: Radiograph 02/24/2014

CLINICAL DATA: AZ4MH-RS positive, shortness of breath, fevers and
chills

EXAM:
PORTABLE CHEST 1 VIEW

[chest ap]
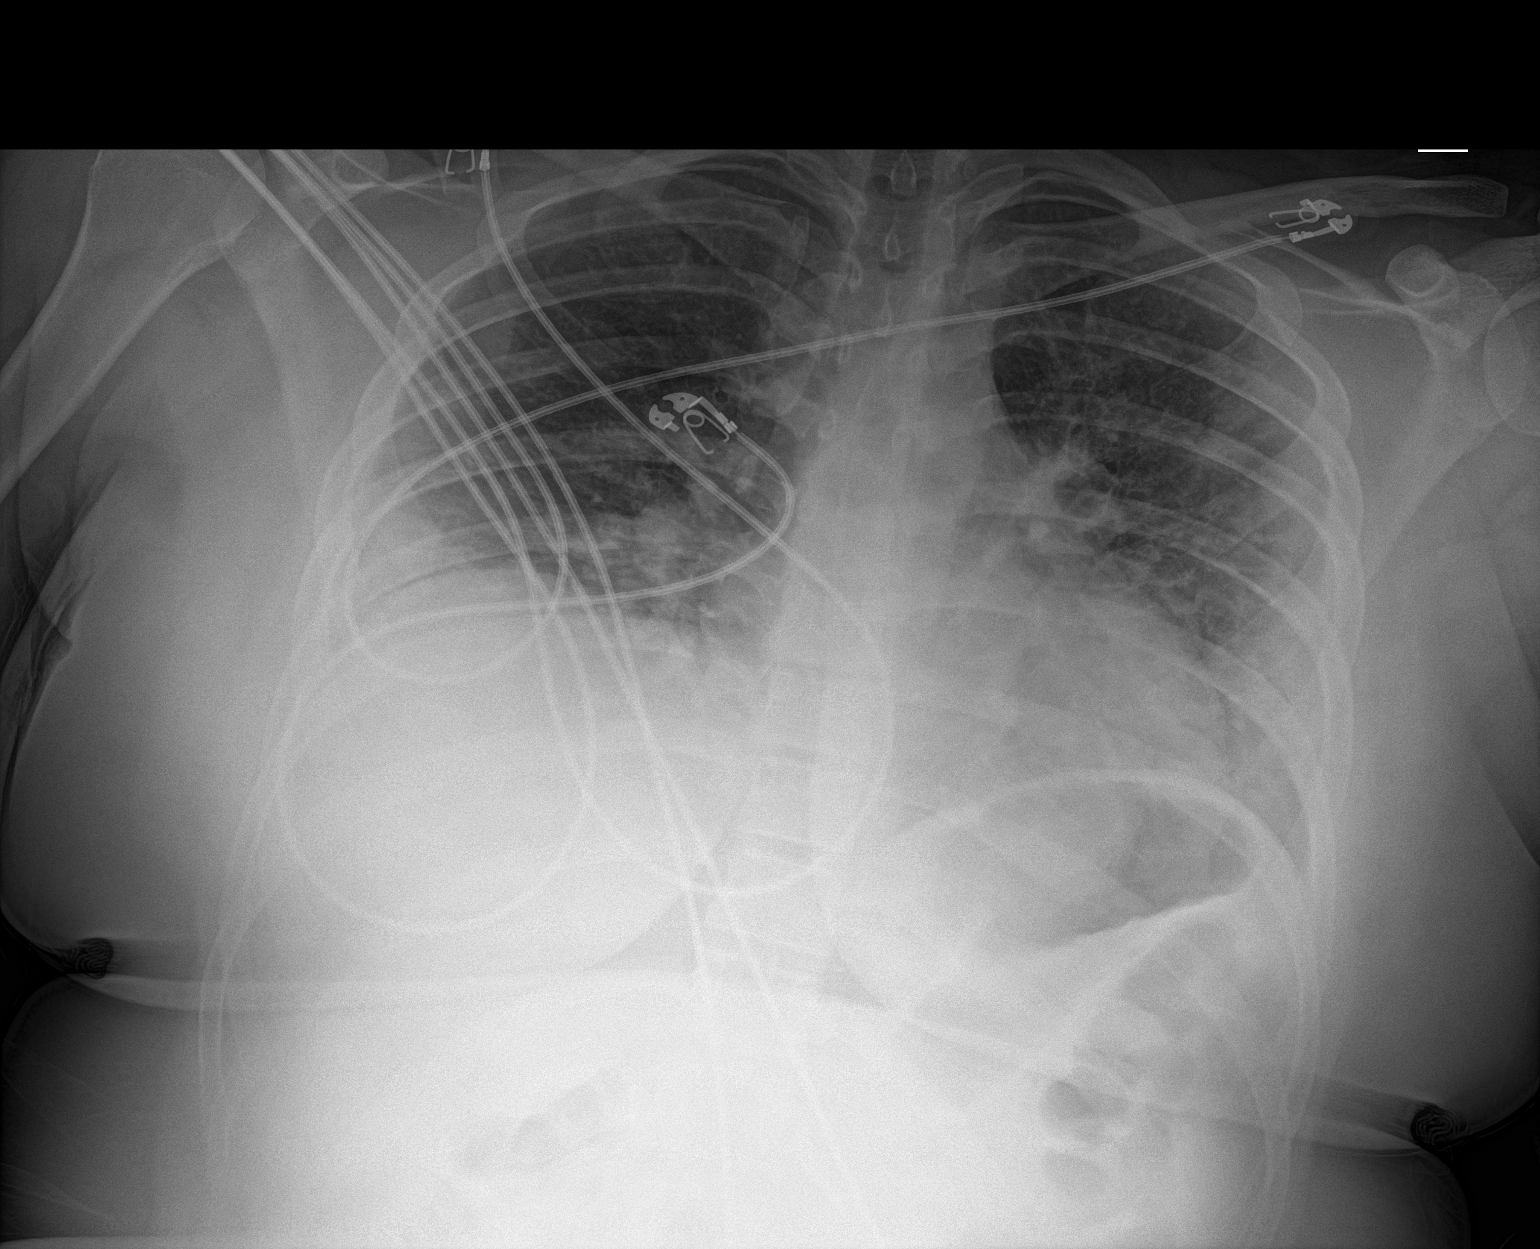

[1 of 1 positions shown; findings below may reference images not displayed]

FINDINGS: Lung volumes are markedly diminished with basilar atelectasis and
superimposed areas of consolidative opacity predominantly along the
periphery of the left lung and partially silhouetting the right
hemidiaphragm and right heart border. No pneumothorax. Possible
small right effusion. No sizable left effusion. Cardiomediastinal
contours are partially obscured by opacity. No acute osseous or soft
tissue abnormality. Telemetry leads overlie the chest.
IMPRESSION: Diminished lung volumes with basilar atelectasis and superimposed
areas of consolidative opacity compatible with a multifocal
pneumonia in the setting of AZ4MH-RS positivity.

Suspect small right effusion as well, could be better determined
with upright PA and lateral radiograph.

## 2021-12-19 IMAGING — CT CT ANGIO CHEST
2 of 6 series · 18 of 46 positions shown · IV contrast (omnipaque)
Comparison: None.

CLINICAL DATA: Shortness of breath, malaise.  COVID positive.

EXAM:
CT ANGIOGRAPHY CHEST WITH CONTRAST
TECHNIQUE: Multidetector CT imaging of the chest was performed using the
standard protocol during bolus administration of intravenous
contrast. Multiplanar CT image reconstructions and MIPs were
obtained to evaluate the vascular anatomy.
CONTRAST:  80mL OMNIPAQUE IOHEXOL 350 MG/ML SOLN

[Series 6: thins · axial · 0.68mm/px · z∈[-216,-20]mm · 15 of 216 slices shown]
[im 10/216  lung]
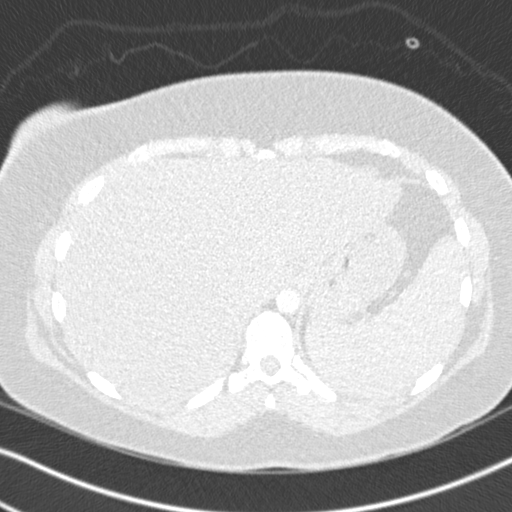
[im 29/216  soft-tissue]
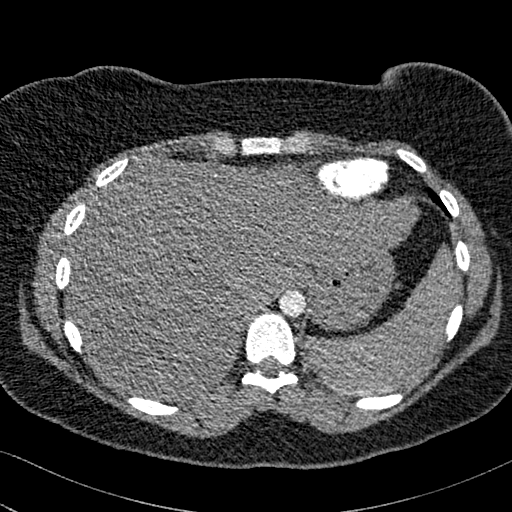
[im 38/216  lung]
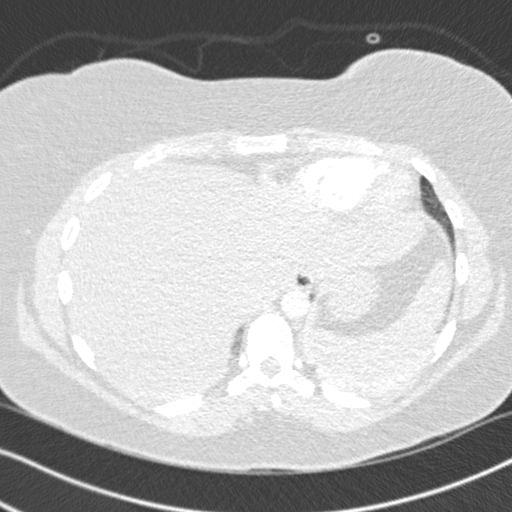
[im 57/216  soft-tissue]
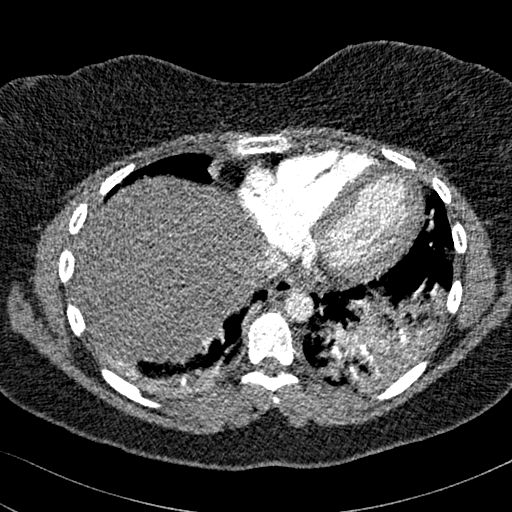
[im 66/216  lung]
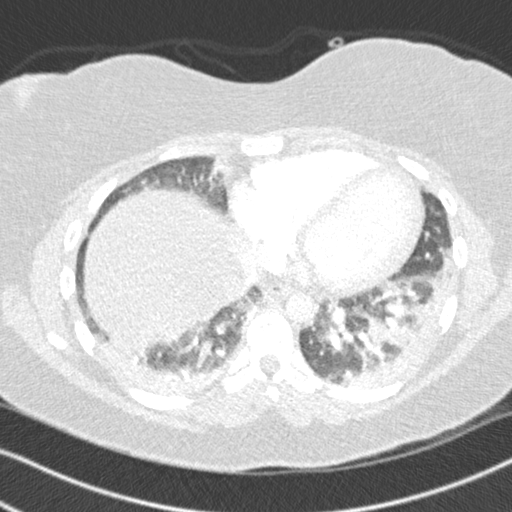
[im 85/216  soft-tissue]
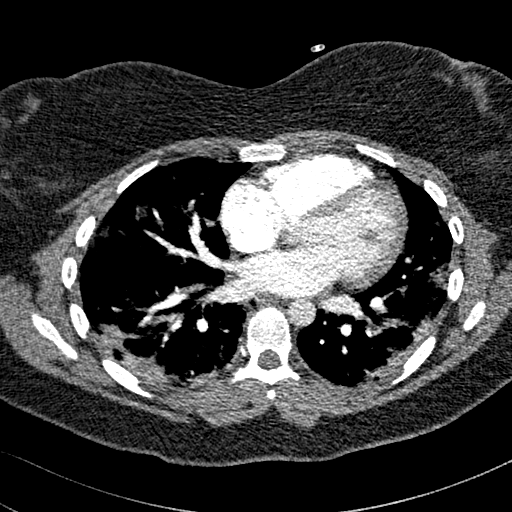
[im 94/216  lung]
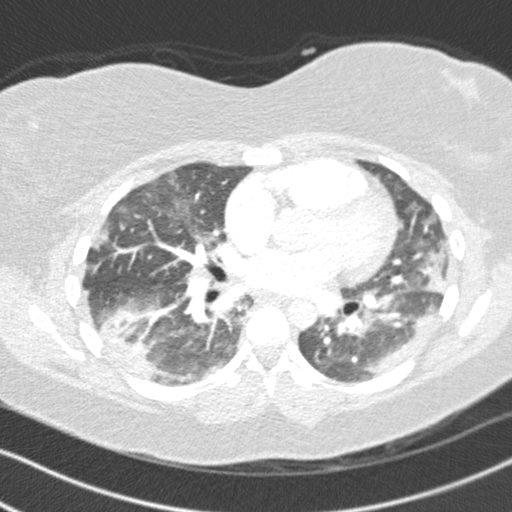
[im 113/216  soft-tissue]
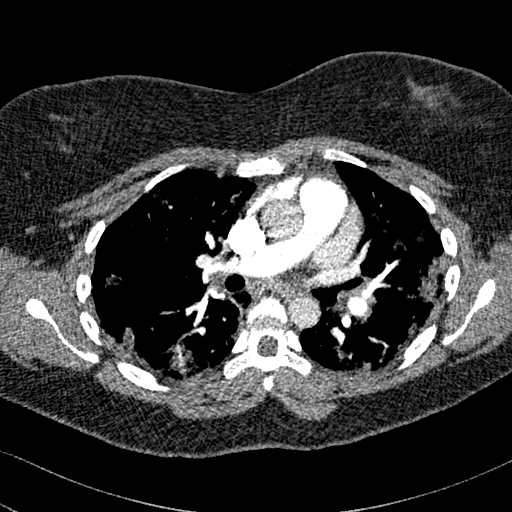
[im 122/216  lung]
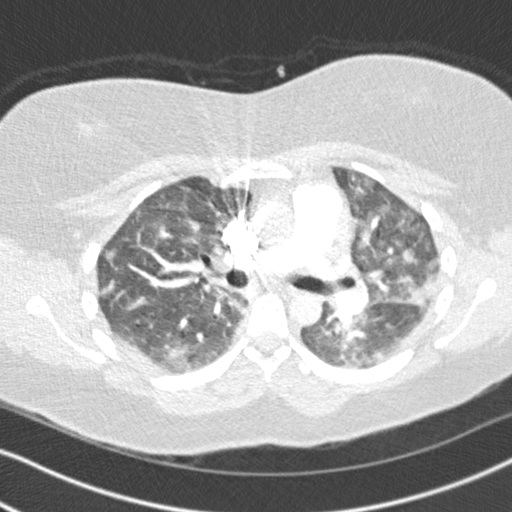
[im 131/216  soft-tissue]
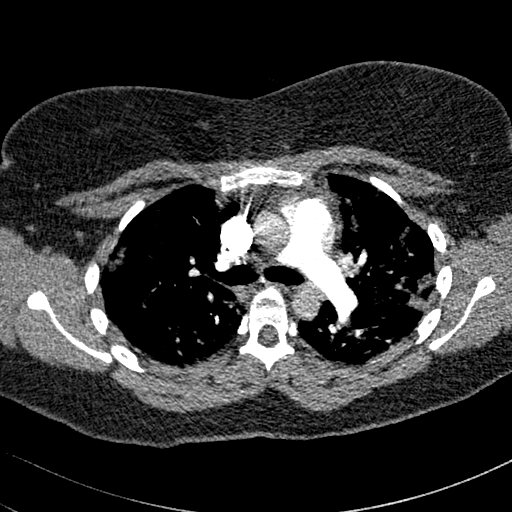
[im 150/216  lung]
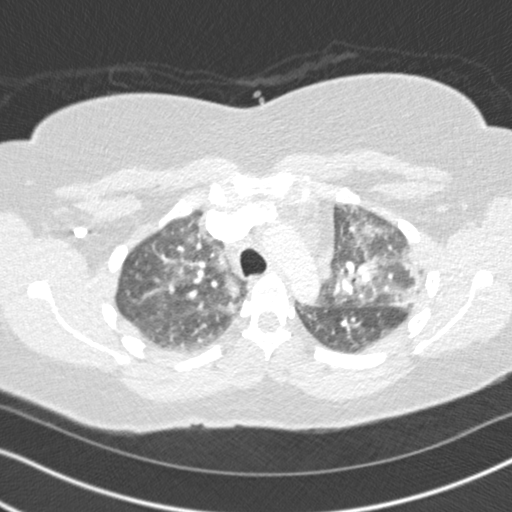
[im 159/216  soft-tissue]
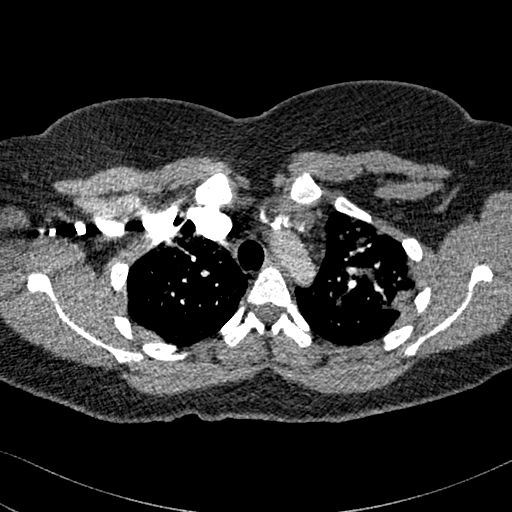
[im 178/216  lung]
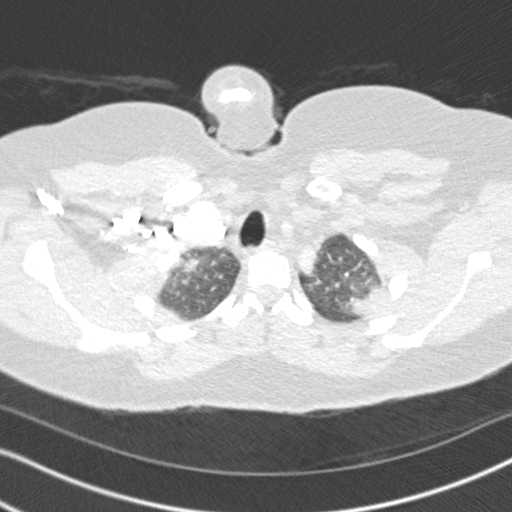
[im 187/216  soft-tissue]
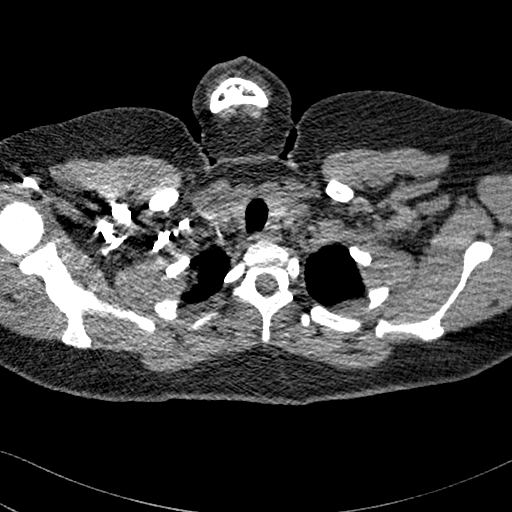
[im 206/216  lung]
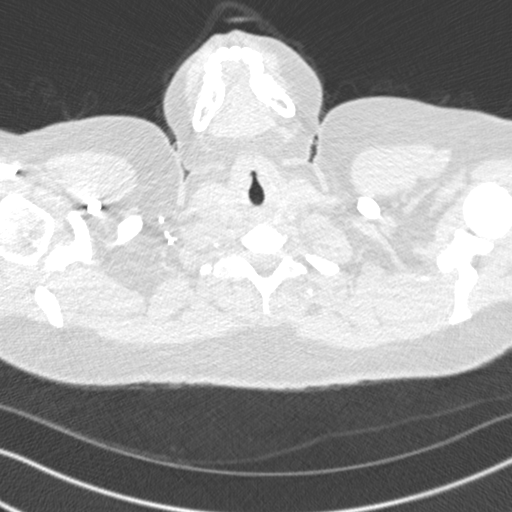

[Series 8: coronal mpr · coronal · 0.46mm/px · 3 of 151 slices shown]
[im 38/151  soft-tissue]
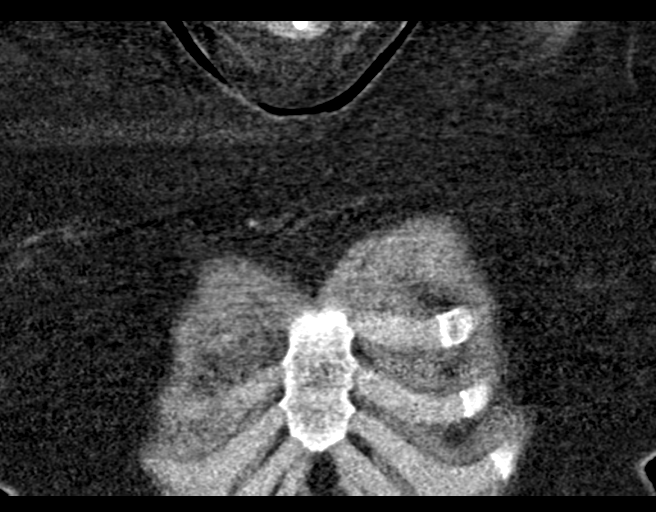
[im 76/151  soft-tissue]
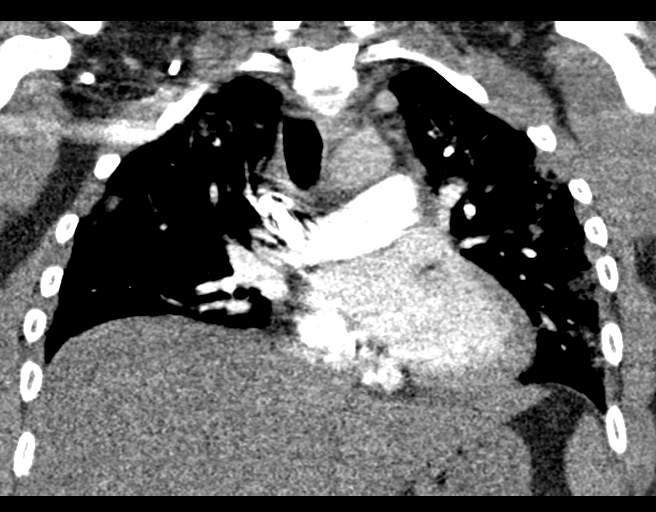
[im 113/151  soft-tissue]
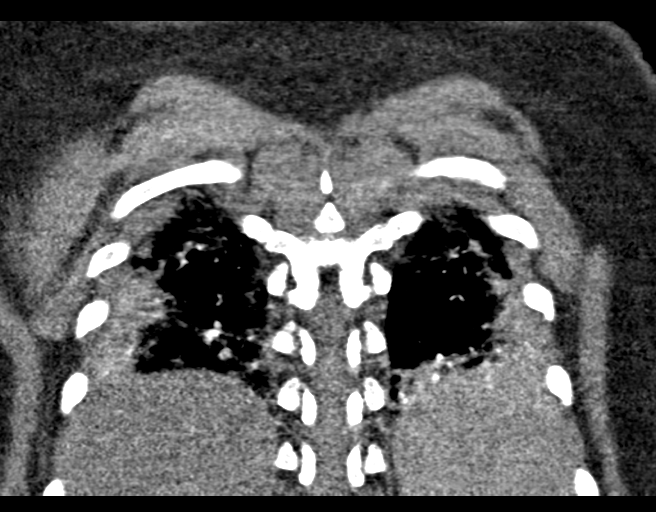

[18 of 46 positions shown; findings below may reference images not displayed]

FINDINGS: Cardiovascular: Heart size upper limits normal. No significant
pericardial effusion. Satisfactory opacification of pulmonary
arteries noted, and there is no evidence of pulmonary emboli. Fair
contrast opacification of the thoracic aorta, which is nondilated.

Mediastinum/Nodes: No hilar or mediastinal adenopathy.

Lungs/Pleura: No significant pleural effusion. No pneumothorax.
Extensive scattered patchy airspace opacities in primarily in the
lung bases with consolidation posteriorly, and scattered
ground-glass opacities throughout the lungs with a predominantly
peripheral distribution.

Upper Abdomen: No acute findings

Musculoskeletal: No chest wall abnormality. No acute or significant
osseous findings.

Review of the MIP images confirms the above findings.
IMPRESSION: 1. Negative for acute PE.
2. Extensive bilateral pulmonary airspace and ground-glass opacities
as above, may represent infectious/inflammatory etiology versus
atypical edema.

## 2023-02-02 ENCOUNTER — Other Ambulatory Visit: Payer: Self-pay | Admitting: Adult Health

## 2023-02-02 MED ORDER — MEGESTROL ACETATE 40 MG PO TABS: 40.0000 mg | ORAL_TABLET | Freq: Two times a day (BID) | ORAL | 1 refills | Status: DC

## 2023-02-02 NOTE — Progress Notes (Signed)
Refilled megace

## 2023-03-27 ENCOUNTER — Ambulatory Visit: Payer: No Typology Code available for payment source | Admitting: Adult Health

## 2023-03-27 ENCOUNTER — Other Ambulatory Visit (HOSPITAL_COMMUNITY)
Admission: RE | Admit: 2023-03-27 | Discharge: 2023-03-27 | Disposition: A | Discharge status: Home / Self Care | Payer: No Typology Code available for payment source | Source: Ambulatory Visit | Attending: Adult Health | Admitting: Adult Health

## 2023-03-27 ENCOUNTER — Encounter: Payer: Self-pay | Admitting: Adult Health

## 2023-03-27 VITALS — BP 137/82 | HR 88 | Ht 69.0 in | Wt 288.0 lb

## 2023-03-27 DIAGNOSIS — Z8742 Personal history of other diseases of the female genital tract: Secondary | ICD-10-CM | POA: Diagnosis not present

## 2023-03-27 DIAGNOSIS — N920 Excessive and frequent menstruation with regular cycle: Secondary | ICD-10-CM | POA: Diagnosis not present

## 2023-03-27 DIAGNOSIS — Z01419 Encounter for gynecological examination (general) (routine) without abnormal findings: Secondary | ICD-10-CM | POA: Insufficient documentation

## 2023-03-27 DIAGNOSIS — N946 Dysmenorrhea, unspecified: Secondary | ICD-10-CM | POA: Diagnosis not present

## 2023-03-27 NOTE — Progress Notes (Signed)
Patient ID: Diane Shelton, female   DOB: Jun 01, 1996, 27 y.o.   MRN: [id number] History of Present Illness: Diane Shelton is a 27 year old white female, single, G0P0, in for a well woman gyn exam and pap. She drives a truck.    Current Medications, Allergies, Past Medical History, Past Surgical History, Family History and Social History were reviewed in [employer] record.     Review of Systems: Patient denies any headaches, hearing loss, fatigue, blurred vision, shortness of breath, chest pain, abdominal pain, problems with bowel movements, urination, or intercourse(has never had sex). No joint pain or mood swings.  Megace has stopped the bleeding has some pain occasionally in side.   Physical Exam:BP 137/82 (BP Location: Left Arm, Patient Position: Sitting, Cuff Size: Large)   Pulse 88   Ht 5\' 9"  (1.753 m)   Wt 288 lb (130.6 kg)   BMI 42.53 kg/m   General:  Well developed, well nourished, no acute distress Skin:  Warm and dry Neck:  Midline trachea, normal thyroid, good ROM, no lymphadenopathy Lungs; Clear to auscultation bilaterally Breast:  No dominant palpable mass, retraction, or nipple discharge Cardiovascular: Regular rate and rhythm Abdomen:  Soft, non tender, no hepatosplenomegaly Pelvic:  External genitalia is normal in appearance, no lesions.  The vagina is normal in appearance,used small [place] and she could barely tolerate. Urethra has no lesions or masses. The cervix is nulliparous and poorly seen, pap with HR  HPV genotyping performed.  Uterus is felt to be normal size, shape, and contour.  No adnexal masses or tenderness noted.Bladder is non tender, no masses felt. Extremities/musculoskeletal:  No swelling or varicosities noted, no clubbing or cyanosis Psych:  No mood changes, alert and cooperative,seems happy AA is 0 Fall risk is low    03/27/2023   11:54 AM 10/18/2017    9:14 AM  Depression screen PHQ 2/9  Decreased Interest 1 0  Down,  Depressed, Hopeless 0 0  PHQ - 2 Score 1 0  Altered sleeping 1   Tired, decreased energy 1   Change in appetite 1   Feeling bad or failure about yourself  1   Trouble concentrating 0   Moving slowly or fidgety/restless 0   Suicidal thoughts 0   PHQ-9 Score 5        03/27/2023   11:54 AM  GAD 7 : Generalized Anxiety Score  Nervous, Anxious, on Edge 0  Control/stop worrying 1  Worry too much - different things 1  Trouble relaxing 0  Restless 0  Easily annoyed or irritable 1  Afraid - awful might happen 0  Total GAD 7 Score 3      Upstream - 03/27/23 1202       Pregnancy Intention Screening   Does the patient want to become pregnant in the next year? No    Does the patient's partner want to become pregnant in the next year? No    Would the patient like to discuss contraceptive options today? No      Contraception Wrap Up   Current Method Abstinence    End Method Abstinence             Examination chaperoned by Malachy Mood LPN  Impression and Plan: 1. Encounter for gynecological examination with Papanicolaou smear of cervix Pap sent Pap in 3 years if normal Physical in 1 year  - Cytology - PAP( [place]) Pt wants to discuss surgical options, return in 4 weeks with Dr Despina Hidden to discuss, does  not want children   2. Dysmenorrhea Controlled with megace, no period Will continue megace 80 mg daily, has refills   3. History of ovarian cyst  4. Menorrhagia with regular cycle Resolved with megace

## 2023-03-30 LAB — CYTOLOGY - PAP
Comment: NEGATIVE
Diagnosis: NEGATIVE
High risk HPV: NEGATIVE

## 2023-04-24 ENCOUNTER — Encounter: Payer: PRIVATE HEALTH INSURANCE | Admitting: Obstetrics & Gynecology

## 2024-01-07 ENCOUNTER — Other Ambulatory Visit: Payer: Self-pay | Admitting: Adult Health

## 2024-07-27 ENCOUNTER — Other Ambulatory Visit: Payer: Self-pay | Admitting: Adult Health
